# Patient Record
Sex: Male | Born: 1980 | Race: Black or African American | Hispanic: No | Marital: Single | Smoking: Current every day smoker
Health system: Southern US, Community
[De-identification: ages and names within clinical notes are randomized; demographics above are authoritative.]

## PROBLEM LIST (undated history)

## (undated) DIAGNOSIS — Z21 Asymptomatic human immunodeficiency virus [HIV] infection status: Secondary | ICD-10-CM

## (undated) DIAGNOSIS — F419 Anxiety disorder, unspecified: Secondary | ICD-10-CM

## (undated) DIAGNOSIS — F32A Depression, unspecified: Secondary | ICD-10-CM

## (undated) DIAGNOSIS — B20 Human immunodeficiency virus [HIV] disease: Secondary | ICD-10-CM

---

## 1998-07-15 ENCOUNTER — Emergency Department (HOSPITAL_COMMUNITY): Admission: EM | Admit: 1998-07-15 | Discharge: 1998-07-15 | Payer: Self-pay | Admitting: Emergency Medicine

## 1998-07-15 ENCOUNTER — Encounter: Payer: Self-pay | Admitting: Emergency Medicine

## 2004-01-06 ENCOUNTER — Emergency Department (HOSPITAL_COMMUNITY): Admission: EM | Admit: 2004-01-06 | Discharge: 2004-01-06 | Payer: Self-pay | Admitting: Emergency Medicine

## 2004-04-21 ENCOUNTER — Emergency Department (HOSPITAL_COMMUNITY): Admission: EM | Admit: 2004-04-21 | Discharge: 2004-04-21 | Payer: Self-pay | Admitting: Emergency Medicine

## 2005-07-06 ENCOUNTER — Encounter: Payer: Self-pay | Admitting: Cardiology

## 2005-07-06 ENCOUNTER — Observation Stay (HOSPITAL_COMMUNITY): Admission: EM | Admit: 2005-07-06 | Discharge: 2005-07-06 | Payer: Self-pay | Admitting: Emergency Medicine

## 2005-07-06 ENCOUNTER — Ambulatory Visit: Payer: Self-pay | Admitting: Cardiology

## 2005-11-09 ENCOUNTER — Emergency Department (HOSPITAL_COMMUNITY): Admission: EM | Admit: 2005-11-09 | Discharge: 2005-11-10 | Payer: Self-pay | Admitting: Emergency Medicine

## 2005-12-05 ENCOUNTER — Emergency Department (HOSPITAL_COMMUNITY): Admission: EM | Admit: 2005-12-05 | Discharge: 2005-12-05 | Payer: Self-pay | Admitting: Emergency Medicine

## 2005-12-30 ENCOUNTER — Emergency Department (HOSPITAL_COMMUNITY): Admission: EM | Admit: 2005-12-30 | Discharge: 2005-12-30 | Payer: Self-pay | Admitting: Emergency Medicine

## 2005-12-31 ENCOUNTER — Emergency Department (HOSPITAL_COMMUNITY): Admission: EM | Admit: 2005-12-31 | Discharge: 2005-12-31 | Payer: Self-pay | Admitting: Emergency Medicine

## 2006-01-31 ENCOUNTER — Emergency Department (HOSPITAL_COMMUNITY): Admission: EM | Admit: 2006-01-31 | Discharge: 2006-01-31 | Payer: Self-pay | Admitting: Emergency Medicine

## 2006-04-15 ENCOUNTER — Emergency Department (HOSPITAL_COMMUNITY): Admission: EM | Admit: 2006-04-15 | Discharge: 2006-04-15 | Payer: Self-pay | Admitting: Emergency Medicine

## 2006-04-19 ENCOUNTER — Emergency Department (HOSPITAL_COMMUNITY): Admission: EM | Admit: 2006-04-19 | Discharge: 2006-04-19 | Payer: Self-pay | Admitting: Emergency Medicine

## 2006-06-06 ENCOUNTER — Emergency Department (HOSPITAL_COMMUNITY): Admission: EM | Admit: 2006-06-06 | Discharge: 2006-06-06 | Payer: Self-pay | Admitting: Emergency Medicine

## 2006-08-19 ENCOUNTER — Emergency Department (HOSPITAL_COMMUNITY): Admission: EM | Admit: 2006-08-19 | Discharge: 2006-08-19 | Payer: Self-pay | Admitting: Emergency Medicine

## 2006-09-02 ENCOUNTER — Emergency Department (HOSPITAL_COMMUNITY): Admission: EM | Admit: 2006-09-02 | Discharge: 2006-09-02 | Payer: Self-pay | Admitting: *Deleted

## 2006-10-30 ENCOUNTER — Emergency Department (HOSPITAL_COMMUNITY): Admission: EM | Admit: 2006-10-30 | Discharge: 2006-10-31 | Payer: Self-pay | Admitting: Emergency Medicine

## 2006-11-05 ENCOUNTER — Emergency Department (HOSPITAL_COMMUNITY): Admission: EM | Admit: 2006-11-05 | Discharge: 2006-11-05 | Payer: Self-pay | Admitting: Emergency Medicine

## 2006-11-15 ENCOUNTER — Emergency Department (HOSPITAL_COMMUNITY): Admission: EM | Admit: 2006-11-15 | Discharge: 2006-11-15 | Payer: Self-pay | Admitting: Emergency Medicine

## 2006-11-19 ENCOUNTER — Emergency Department (HOSPITAL_COMMUNITY): Admission: EM | Admit: 2006-11-19 | Discharge: 2006-11-19 | Payer: Self-pay | Admitting: Emergency Medicine

## 2007-02-04 ENCOUNTER — Emergency Department (HOSPITAL_COMMUNITY): Admission: EM | Admit: 2007-02-04 | Discharge: 2007-02-04 | Payer: Self-pay | Admitting: Emergency Medicine

## 2007-02-12 ENCOUNTER — Emergency Department (HOSPITAL_COMMUNITY): Admission: EM | Admit: 2007-02-12 | Discharge: 2007-02-12 | Payer: Self-pay | Admitting: Emergency Medicine

## 2007-03-28 ENCOUNTER — Emergency Department (HOSPITAL_COMMUNITY): Admission: EM | Admit: 2007-03-28 | Discharge: 2007-03-28 | Payer: Self-pay | Admitting: Emergency Medicine

## 2007-07-07 ENCOUNTER — Emergency Department (HOSPITAL_COMMUNITY): Admission: EM | Admit: 2007-07-07 | Discharge: 2007-07-07 | Payer: Self-pay | Admitting: Emergency Medicine

## 2007-07-20 ENCOUNTER — Emergency Department (HOSPITAL_COMMUNITY): Admission: EM | Admit: 2007-07-20 | Discharge: 2007-07-21 | Payer: Self-pay | Admitting: Emergency Medicine

## 2008-01-12 ENCOUNTER — Emergency Department (HOSPITAL_COMMUNITY): Admission: EM | Admit: 2008-01-12 | Discharge: 2008-01-12 | Payer: Self-pay | Admitting: Emergency Medicine

## 2008-02-17 ENCOUNTER — Emergency Department (HOSPITAL_COMMUNITY): Admission: EM | Admit: 2008-02-17 | Discharge: 2008-02-17 | Payer: Self-pay | Admitting: Emergency Medicine

## 2008-02-28 ENCOUNTER — Emergency Department (HOSPITAL_COMMUNITY): Admission: EM | Admit: 2008-02-28 | Discharge: 2008-02-28 | Payer: Self-pay | Admitting: Emergency Medicine

## 2008-03-15 ENCOUNTER — Emergency Department (HOSPITAL_COMMUNITY): Admission: EM | Admit: 2008-03-15 | Discharge: 2008-03-16 | Payer: Self-pay | Admitting: Emergency Medicine

## 2008-06-03 ENCOUNTER — Emergency Department (HOSPITAL_COMMUNITY): Admission: EM | Admit: 2008-06-03 | Discharge: 2008-06-03 | Payer: Self-pay | Admitting: Emergency Medicine

## 2008-07-14 ENCOUNTER — Emergency Department (HOSPITAL_COMMUNITY): Admission: EM | Admit: 2008-07-14 | Discharge: 2008-07-14 | Payer: Self-pay | Admitting: Emergency Medicine

## 2008-11-10 ENCOUNTER — Emergency Department (HOSPITAL_COMMUNITY): Admission: EM | Admit: 2008-11-10 | Discharge: 2008-11-10 | Payer: Self-pay | Admitting: Emergency Medicine

## 2009-05-15 ENCOUNTER — Emergency Department (HOSPITAL_COMMUNITY): Admission: EM | Admit: 2009-05-15 | Discharge: 2009-05-15 | Payer: Self-pay | Admitting: Emergency Medicine

## 2009-05-16 ENCOUNTER — Emergency Department (HOSPITAL_COMMUNITY): Admission: EM | Admit: 2009-05-16 | Discharge: 2009-05-16 | Payer: Self-pay | Admitting: Emergency Medicine

## 2009-10-20 ENCOUNTER — Emergency Department (HOSPITAL_COMMUNITY): Admission: EM | Admit: 2009-10-20 | Discharge: 2009-10-20 | Payer: Self-pay | Admitting: Emergency Medicine

## 2009-12-11 ENCOUNTER — Emergency Department (HOSPITAL_COMMUNITY): Admission: EM | Admit: 2009-12-11 | Discharge: 2009-12-11 | Payer: Self-pay | Admitting: Emergency Medicine

## 2010-01-24 ENCOUNTER — Ambulatory Visit: Payer: Self-pay | Admitting: Adult Health

## 2010-02-23 ENCOUNTER — Emergency Department (HOSPITAL_COMMUNITY)
Admission: EM | Admit: 2010-02-23 | Discharge: 2010-02-23 | Payer: Self-pay | Source: Home / Self Care | Admitting: Emergency Medicine

## 2010-04-18 LAB — CBC
HCT: 34.2 % — ABNORMAL LOW (ref 39.0–52.0)
Hemoglobin: 11.7 g/dL — ABNORMAL LOW (ref 13.0–17.0)
MCH: 31.5 pg (ref 26.0–34.0)
MCHC: 34.2 g/dL (ref 30.0–36.0)
MCV: 92 fL (ref 78.0–100.0)
Platelets: 98 10*3/uL — ABNORMAL LOW (ref 150–400)
RBC: 3.71 MIL/uL — ABNORMAL LOW (ref 4.22–5.81)
RDW: 13.6 % (ref 11.5–15.5)
WBC: 3.5 10*3/uL — ABNORMAL LOW (ref 4.0–10.5)

## 2010-04-18 LAB — BASIC METABOLIC PANEL
BUN: 18 mg/dL (ref 6–23)
CO2: 27 mEq/L (ref 19–32)
Chloride: 100 mEq/L (ref 96–112)
Creatinine, Ser: 0.76 mg/dL (ref 0.4–1.5)
Glucose, Bld: 85 mg/dL (ref 70–99)
Sodium: 136 mEq/L (ref 135–145)

## 2010-04-18 LAB — DIFFERENTIAL
Eosinophils Absolute: 0 10*3/uL (ref 0.0–0.7)
Eosinophils Relative: 1 % (ref 0–5)
Monocytes Absolute: 0.4 10*3/uL (ref 0.1–1.0)
Neutro Abs: 1.8 10*3/uL (ref 1.7–7.7)
Neutrophils Relative %: 51 % (ref 43–77)

## 2010-04-18 LAB — HIV ANTIBODY (ROUTINE TESTING W REFLEX)

## 2010-04-20 LAB — CBC
HCT: 39.7 % (ref 39.0–52.0)
Hemoglobin: 13.8 g/dL (ref 13.0–17.0)
MCH: 32.8 pg (ref 26.0–34.0)
RBC: 4.21 MIL/uL — ABNORMAL LOW (ref 4.22–5.81)

## 2010-04-20 LAB — DIFFERENTIAL
Lymphocytes Relative: 33 % (ref 12–46)
Monocytes Relative: 9 % (ref 3–12)

## 2010-04-26 LAB — HEPATIC FUNCTION PANEL
Albumin: 4.1 g/dL (ref 3.5–5.2)
Total Bilirubin: 0.6 mg/dL (ref 0.3–1.2)
Total Protein: 8.9 g/dL — ABNORMAL HIGH (ref 6.0–8.3)

## 2010-04-26 LAB — URINALYSIS, ROUTINE W REFLEX MICROSCOPIC
Glucose, UA: NEGATIVE mg/dL
Hgb urine dipstick: NEGATIVE
Specific Gravity, Urine: 1.039 — ABNORMAL HIGH (ref 1.005–1.030)

## 2010-04-26 LAB — CK TOTAL AND CKMB (NOT AT ARMC)
CK, MB: 0.5 ng/mL (ref 0.3–4.0)
Total CK: 114 U/L (ref 7–232)

## 2010-04-26 LAB — URINE MICROSCOPIC-ADD ON

## 2010-04-26 LAB — POCT I-STAT, CHEM 8
BUN: 15 mg/dL (ref 6–23)
Creatinine, Ser: 1 mg/dL (ref 0.4–1.5)
Hemoglobin: 16 g/dL (ref 13.0–17.0)
Potassium: 4.8 mEq/L (ref 3.5–5.1)
Sodium: 139 mEq/L (ref 135–145)

## 2010-04-29 ENCOUNTER — Emergency Department (HOSPITAL_COMMUNITY)
Admission: EM | Admit: 2010-04-29 | Discharge: 2010-04-30 | Disposition: A | Discharge status: Home / Self Care | Payer: No Typology Code available for payment source | Attending: Emergency Medicine | Admitting: Emergency Medicine

## 2010-04-29 ENCOUNTER — Emergency Department (HOSPITAL_COMMUNITY): Payer: Self-pay

## 2010-04-29 DIAGNOSIS — M545 Low back pain, unspecified: Secondary | ICD-10-CM | POA: Insufficient documentation

## 2010-04-29 DIAGNOSIS — S025XXA Fracture of tooth (traumatic), initial encounter for closed fracture: Secondary | ICD-10-CM | POA: Insufficient documentation

## 2010-04-29 DIAGNOSIS — F1010 Alcohol abuse, uncomplicated: Secondary | ICD-10-CM | POA: Insufficient documentation

## 2010-04-29 DIAGNOSIS — R51 Headache: Secondary | ICD-10-CM | POA: Insufficient documentation

## 2010-04-29 DIAGNOSIS — S0003XA Contusion of scalp, initial encounter: Secondary | ICD-10-CM | POA: Insufficient documentation

## 2010-05-15 LAB — RAPID STREP SCREEN (MED CTR MEBANE ONLY): Streptococcus, Group A Screen (Direct): NEGATIVE

## 2010-05-23 LAB — D-DIMER, QUANTITATIVE: D-Dimer, Quant: <0.22 ug/mL-FEU (ref 0.00–0.48)

## 2010-05-23 LAB — CBC
MCV: 95.1 fL (ref 78.0–100.0)
RBC: 3.98 MIL/uL — ABNORMAL LOW (ref 4.22–5.81)
WBC: 8.6 10*3/uL (ref 4.0–10.5)

## 2010-05-23 LAB — POCT I-STAT, CHEM 8
BUN: 21 mg/dL (ref 6–23)
Calcium, Ion: 1.21 mmol/L (ref 1.12–1.32)
Chloride: 104 mEq/L (ref 96–112)
Potassium: 3.5 mEq/L (ref 3.5–5.1)

## 2010-05-23 LAB — POCT CARDIAC MARKERS
CKMB, poc: 1.4 ng/mL (ref 1.0–8.0)
Myoglobin, poc: 20.5 ng/mL (ref 12–200)
Troponin i, poc: <0.05 ng/mL (ref 0.00–0.09)

## 2010-06-23 NOTE — H&P (Signed)
NAME:  NOOR, VIDALES NO.:  [id number]   MEDICAL RECORD NO.:  [id number]          PATIENT TYPE:  EMS   LOCATION:  [facility]                         FACILITY:  [facility]   PHYSICIAN:  Mobolaji B. Bakare, M.D.DATE OF BIRTH:  [birth year]   DATE OF ADMISSION:  07/06/2005  DATE OF DISCHARGE:                                HISTORY & PHYSICAL   PRIMARY CARE PHYSICIAN:  Quita Skye. Kindl, M.D.   CHIEF COMPLAINT:  Abdominal pain, periumbilical, that started yesterday  afternoon at work.   HISTORY OF PRESENT ILLNESS:  Mr. Launer is a 30 year old, African-American  male who seems to have history of panic attacks.  He was at work yesterday  afternoon and suddenly developed periumbilical pain which rated at 7/10.  The pain is sharp and nonradiating.  It has remained constant.  There is no  associated vomiting or diarrhea.  He has not experienced any change in his  bowel habits, although his bowel habit is quite irregular.  He denies fever,  chills.  About the same time he developed this abdominal pain, he became  short of breath with associated perioral numbness and numbness of both arms.  He was brought to the emergency room for evaluation.   The patient had chest pain 2 weeks ago which lasted approximately 1 week.  It was retrosternal and he stated it was nonradiating.  It was associated  with feeling of chest tightness and difficulty with breathing.  There was no  palpitation.  This is similar to the chest pain experience he had in March  2006.   Blood work with cardiac enzymes and point of care markers done in the  emergency room revealed slightly elevated troponin of 0.17 to 0.18.  He has  normal CK-MB.  His EKG is normal sinus rhythm with early repolarization.   PAST MEDICAL HISTORY:  1.  Gout.  2.  Panic attacks.  3.  Back pain.   PAST SURGICAL HISTORY:  None.   MEDICATIONS:  Ibuprofen, Lorazepam, oxycodone, acetaminophen.   ALLERGIES:  No known drug allergies.   FAMILY HISTORY:  He is the last child of the family.  Both parents are alive  and they both have hypertension.  No family history of premature coronary  artery disease or cardiac problems that he is aware of.  No family history  of diabetes mellitus or cancer.   SOCIAL HISTORY:  The patient smokes cigarettes and finishes one pack in 3  days.  He occasionally drinks alcohol.  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  He is a Engineer, agricultural.   PHYSICAL EXAMINATION:  VITAL SIGNS:  Temperature 97.2, blood pressure  107/65, pulse 69, respirations 18, O2 saturations 100%.  GENERAL:  The patient is awake, oriented and alert to time, place and  person.  HEENT:  Normocephalic, atraumatic head.  Pupils equal round and reactive to  light, extraocular movements intact.  No elevated JVD or carotid bruits.  LUNGS:  Clear clinically to auscultation.  CARDIAC:  S1 and S2 regular.  No murmurs, rubs or gallops.  ABDOMEN:  Nondistended,  soft, mild periumbilical tenderness.  No palpable  organomegaly.  Bowel sounds present.  EXTREMITIES:  No pedal edema.  No calf tenderness.  NEUROLOGIC:  No focal neurological deficits.   LABORATORY DATA AND X-RAY FINDINGS:  White cell count 7.2, hemoglobin 13.7,  hematocrit 40.3, platelets 150, neutrophils 65, lymphs 27%.  ABG with pH  7.35, pCO2 51.5, bicarb 28.  Sodium 138, potassium 4.2, chloride 104,  glucose 118, BUN 19, creatinine 1.2.  Cardiac markers at 3:10 a.m. with CK-  MB 3.1, troponin 0.18, myoglobin 53.4.  D-dimer less than 0.22.  Urinalysis  showed moderate leukocytes.  Appearance is hazy.  Microscopy shows 11-20  wbc's.  Second set of cardiac markers with CK-MB normal, troponin 0.17.  Third set of cardiac markers with CK-MB normal, troponin 0.08.  Urine drug  screen with no abnormality detected.   Chest x-ray shows no active disease.   ASSESSMENT/PLAN:  1.  Mr. Ranganathan is a 30 year old, African-American male with history of  panic      attacks presenting with sudden-onset of abdominal pain associated with      symptoms suggestive of anxiety, i.e. numbness, tingling in lower limbs      and both hands.  I am not sure if the abdominal pain is related to the      numbness and tingling.  I would like to evaluate this further with a      computed tomography scan of the abdomen and pelvis to rule out      appendicitis, kidney and renal stones.  Will keep nothing by mouth until      computed tomography scan is reviewed.  Dilaudid 0.5 mg intravenous every      4 hours as needed for pain.  Check amylase and lipase.  2.  Numbness and tingling in lower limbs and hands, most likely anxiety.      The patient has a history of panic attack.  He would benefit from      psychiatric consult.  Will place on Xanax 0.5 mg every 4 hours p.r.n.      Check thyroid stimulating hormone.  3.  Chest pain.  He had chest pain 2 weeks ago and now has elevated troponin      without CK-MB elevation.  EKG is normal with early repolarization.      Probable pericarditis.  Will check 2-D echocardiogram, fasting lipid      profile, hemoglobin A1c and cycle cardiac enzymes.  Aspirin 81 mg daily.  4.  Asymptomatic bacteruria.  The patient has some white blood cells on      urine microscopy.  He is asymptomatic.  I will hold off antibiotic until      culture is available.  He does not have any fever or leukocytosis.      Mobolaji B. Corky Downs, M.D.  Electronically Signed     MBB/MEDQ  D:  07/06/2005  T:  07/06/2005  Job:  [id number]   cc:   Quita Skye. Artis Flock, M.D.  Fax: [phone]

## 2010-07-05 LAB — HEPATIC FUNCTION PANEL: Bilirubin, Total: 0.4 mg/dL

## 2010-07-05 LAB — CBC AND DIFFERENTIAL
HCT: 37 % — AB (ref 41–53)
Hemoglobin: 12.5 g/dL — AB (ref 13.5–17.5)
Platelets: 113 10*3/uL — AB (ref 150–399)

## 2010-07-05 LAB — BASIC METABOLIC PANEL: Sodium: 138 mmol/L (ref 137–147)

## 2010-10-26 LAB — GC/CHLAMYDIA PROBE AMP, GENITAL: GC Probe Amp, Genital: NEGATIVE

## 2010-11-02 LAB — URINALYSIS, ROUTINE W REFLEX MICROSCOPIC
Glucose, UA: NEGATIVE
Hgb urine dipstick: NEGATIVE
Ketones, ur: 15 — AB
Protein, ur: NEGATIVE
pH: 6

## 2010-11-02 LAB — CBC
HCT: 40.1
Hemoglobin: 14.1
MCHC: 35
MCV: 95.3
Platelets: 221
RDW: 13.5

## 2010-11-02 LAB — POCT I-STAT, CHEM 8
BUN: 19
Calcium, Ion: 1.18
Chloride: 103
Creatinine, Ser: 1.1
TCO2: 28

## 2010-11-02 LAB — DIFFERENTIAL
Basophils Absolute: 0
Basophils Relative: 0
Eosinophils Absolute: 0.1
Eosinophils Relative: 1
Monocytes Absolute: 0.5

## 2010-11-02 LAB — POCT CARDIAC MARKERS: Operator id: 277751

## 2010-11-02 LAB — URINE MICROSCOPIC-ADD ON

## 2010-11-02 LAB — RAPID STREP SCREEN (MED CTR MEBANE ONLY): Streptococcus, Group A Screen (Direct): POSITIVE — AB

## 2010-11-06 ENCOUNTER — Emergency Department (HOSPITAL_COMMUNITY)
Admission: EM | Admit: 2010-11-06 | Discharge: 2010-11-06 | Disposition: A | Discharge status: Home / Self Care | Payer: Self-pay | Attending: Emergency Medicine | Admitting: Emergency Medicine

## 2010-11-06 DIAGNOSIS — B9789 Other viral agents as the cause of diseases classified elsewhere: Secondary | ICD-10-CM | POA: Insufficient documentation

## 2010-11-06 DIAGNOSIS — Z21 Asymptomatic human immunodeficiency virus [HIV] infection status: Secondary | ICD-10-CM | POA: Insufficient documentation

## 2010-11-17 ENCOUNTER — Emergency Department (HOSPITAL_COMMUNITY)
Admission: EM | Admit: 2010-11-17 | Discharge: 2010-11-17 | Disposition: A | Discharge status: Home / Self Care | Payer: No Typology Code available for payment source | Attending: Emergency Medicine | Admitting: Emergency Medicine

## 2010-11-17 DIAGNOSIS — R1013 Epigastric pain: Secondary | ICD-10-CM | POA: Insufficient documentation

## 2010-11-17 DIAGNOSIS — K3189 Other diseases of stomach and duodenum: Secondary | ICD-10-CM | POA: Insufficient documentation

## 2010-11-17 DIAGNOSIS — Z21 Asymptomatic human immunodeficiency virus [HIV] infection status: Secondary | ICD-10-CM | POA: Insufficient documentation

## 2010-11-17 LAB — COMPREHENSIVE METABOLIC PANEL
ALT: 8 U/L (ref 0–53)
AST: 17 U/L (ref 0–37)
Albumin: 3.7 g/dL (ref 3.5–5.2)
Calcium: 9.9 mg/dL (ref 8.4–10.5)
Chloride: 100 mEq/L (ref 96–112)
Creatinine, Ser: 0.81 mg/dL (ref 0.50–1.35)
Glucose, Bld: 88 mg/dL (ref 70–99)
Potassium: 4.1 mEq/L (ref 3.5–5.1)
Total Protein: 9.2 g/dL — ABNORMAL HIGH (ref 6.0–8.3)

## 2010-11-17 LAB — CBC
HCT: 39 % (ref 39.0–52.0)
Hemoglobin: 13.6 g/dL (ref 13.0–17.0)
WBC: 3.4 10*3/uL — ABNORMAL LOW (ref 4.0–10.5)

## 2010-11-17 LAB — DIFFERENTIAL
Basophils Absolute: 0 10*3/uL (ref 0.0–0.1)
Lymphocytes Relative: 48 % — ABNORMAL HIGH (ref 12–46)
Lymphs Abs: 1.7 10*3/uL (ref 0.7–4.0)
Monocytes Absolute: 0.4 10*3/uL (ref 0.1–1.0)
Neutro Abs: 1.3 10*3/uL — ABNORMAL LOW (ref 1.7–7.7)

## 2011-03-09 ENCOUNTER — Encounter (HOSPITAL_COMMUNITY): Payer: Self-pay | Admitting: Emergency Medicine

## 2011-03-09 ENCOUNTER — Emergency Department (HOSPITAL_COMMUNITY)
Admission: EM | Admit: 2011-03-09 | Discharge: 2011-03-09 | Disposition: A | Discharge status: Home / Self Care | Payer: Self-pay | Attending: Emergency Medicine | Admitting: Emergency Medicine

## 2011-03-09 ENCOUNTER — Emergency Department (HOSPITAL_COMMUNITY): Payer: Self-pay

## 2011-03-09 ENCOUNTER — Other Ambulatory Visit: Payer: Self-pay

## 2011-03-09 DIAGNOSIS — F17200 Nicotine dependence, unspecified, uncomplicated: Secondary | ICD-10-CM | POA: Insufficient documentation

## 2011-03-09 DIAGNOSIS — R0789 Other chest pain: Secondary | ICD-10-CM | POA: Insufficient documentation

## 2011-03-09 DIAGNOSIS — K4090 Unilateral inguinal hernia, without obstruction or gangrene, not specified as recurrent: Secondary | ICD-10-CM | POA: Insufficient documentation

## 2011-03-09 LAB — POCT I-STAT TROPONIN I: Troponin i, poc: 0 ng/mL (ref 0.00–0.08)

## 2011-03-09 LAB — BASIC METABOLIC PANEL
CO2: 26 mEq/L (ref 19–32)
Calcium: 9 mg/dL (ref 8.4–10.5)
Chloride: 103 mEq/L (ref 96–112)
Creatinine, Ser: 1.11 mg/dL (ref 0.50–1.35)
Glucose, Bld: 93 mg/dL (ref 70–99)
Sodium: 137 mEq/L (ref 135–145)

## 2011-03-09 LAB — CBC
Hemoglobin: 12.8 g/dL — ABNORMAL LOW (ref 13.0–17.0)
MCH: 32.9 pg (ref 26.0–34.0)
MCV: 96.1 fL (ref 78.0–100.0)
Platelets: 102 10*3/uL — ABNORMAL LOW (ref 150–400)
RBC: 3.89 MIL/uL — ABNORMAL LOW (ref 4.22–5.81)
WBC: 3 10*3/uL — ABNORMAL LOW (ref 4.0–10.5)

## 2011-03-09 LAB — DIFFERENTIAL
Basophils Relative: 1 % (ref 0–1)
Eosinophils Relative: 3 % (ref 0–5)
Lymphs Abs: 1.6 10*3/uL (ref 0.7–4.0)
Monocytes Absolute: 0.4 10*3/uL (ref 0.1–1.0)
Monocytes Relative: 12 % (ref 3–12)

## 2011-03-09 MED ORDER — KETOROLAC TROMETHAMINE 60 MG/2ML IM SOLN
60.0000 mg | Freq: Once | INTRAMUSCULAR | Status: AC
  Administered 2011-03-09: 60 mg via INTRAMUSCULAR
  Filled 2011-03-09: qty 2

## 2011-03-09 MED ORDER — MELOXICAM 7.5 MG PO TABS: 7.5000 mg | ORAL_TABLET | Freq: Every day | ORAL | Status: DC

## 2011-03-09 MED ORDER — ASPIRIN 81 MG PO CHEW
324.0000 mg | CHEWABLE_TABLET | Freq: Once | ORAL | Status: AC
  Administered 2011-03-09: 324 mg via ORAL
  Filled 2011-03-09: qty 4

## 2011-03-09 NOTE — ED Notes (Signed)
Pt c/o left sided CP since this morning.  States pain is intermittent, 8/10 at this time.  Pain does not worsen with inspiration or palpation.  Denies n/v/d.  Also c/o 10/10 pain in hernia near the left groin.  States he has had hernia since he was a baby and they have been unable to reduce it.

## 2011-03-09 NOTE — ED Provider Notes (Signed)
Medical screening examination/treatment/procedure(s) were performed by non-physician practitioner and as supervising physician I was immediately available for consultation/collaboration.   Hanley Seamen, MD 03/09/11 [phone]

## 2011-03-09 NOTE — ED Notes (Signed)
Pt. Reports intermittent mid sternal chest pain onset yesterday morning , denies sob , no nausea or diaphoresis. Pt. also reports left inguinal pain for several weeks.

## 2011-03-09 NOTE — ED Provider Notes (Signed)
History     CSN: [id number]  Arrival date & time 03/09/11  0159   First MD Initiated Contact with Patient 03/09/11 [phone]      Chief Complaint  Patient presents with  . Chest Pain    (Consider location/radiation/quality/duration/timing/severity/associated sxs/prior treatment) HPI Comments: Patient here with intermittent left anterior chest pain - states that the pain woke him up yesterday morning at about 1am - states that he stayed up for a while, then went back to sleep - states that the pain is intermittent and nothing he has done has made the pain either better or worse, though sitting forward does make it slightly worse, he has a history of pericarditis in the past but states that this does not feel like the past one.  Denies cough, congestion, fever, chills, shortness of breath, nausea or vomiting.  Patient is a 30 y.o. male presenting with chest pain. The history is provided by the patient. No language interpreter was used.  Chest Pain The chest pain began 2 days ago. Duration of episode(s) is 4 hours. Chest pain occurs intermittently. The chest pain is worsening. At its most intense, the pain is at 9/10. The pain is currently at 5/10. The severity of the pain is severe. The quality of the pain is described as sharp and squeezing. The pain does not radiate. Chest pain is worsened by certain positions. Pertinent negatives for primary symptoms include no fever, no syncope, no shortness of breath, no cough, no palpitations, no abdominal pain, no nausea, no vomiting, no dizziness and no altered mental status.  Pertinent negatives for associated symptoms include no claudication, no lower extremity edema, no near-syncope, no numbness, no orthopnea, no paroxysmal nocturnal dyspnea and no weakness. He tried nothing for the symptoms. Risk factors include smoking/tobacco exposure.  Pertinent negatives for past medical history include no CAD, no diabetes, no hyperlipidemia, no hypertension, no PE and  no stimulant use.  His family medical history is significant for diabetes in family, heart disease in family and hypertension in family.  Pertinent negatives for family medical history include: no early MI in family.     History reviewed. No pertinent past medical history.  History reviewed. No pertinent past surgical history.  No family history on file.  History  Substance Use Topics  . Smoking status: Current Everyday Smoker  . Smokeless tobacco: Not on file  . Alcohol Use: Yes      Review of Systems  Constitutional: Negative for fever.  Respiratory: Negative for cough and shortness of breath.   Cardiovascular: Positive for chest pain. Negative for palpitations, orthopnea, claudication, syncope and near-syncope.  Gastrointestinal: Negative for nausea, vomiting and abdominal pain.  Neurological: Negative for dizziness, weakness and numbness.  Psychiatric/Behavioral: Negative for altered mental status.  All other systems reviewed and are negative.    Allergies  Review of patient's allergies indicates not on file.  Home Medications  No current outpatient prescriptions on file.  BP 99/57  Pulse 65  Temp(Src) 97.9 F (36.6 C) (Oral)  Resp 18  SpO2 99%  Physical Exam  Nursing note and vitals reviewed. Constitutional: He is oriented to person, place, and time. He appears well-developed and well-nourished. No distress.  HENT:  Head: Normocephalic and atraumatic.  Right Ear: External ear normal.  Left Ear: External ear normal.  Nose: Nose normal.  Mouth/Throat: Oropharynx is clear and moist. No oropharyngeal exudate.  Eyes: Conjunctivae are normal. Pupils are equal, round, and reactive to light. No scleral icterus.  Neck: Normal range  of motion. Neck supple.  Cardiovascular: Normal rate, regular rhythm and normal heart sounds.  Exam reveals no gallop and no friction rub.   No murmur heard. Pulmonary/Chest: Effort normal and breath sounds normal. No respiratory  distress. He has no wheezes. He has no rales.    Abdominal: Soft. Bowel sounds are normal. He exhibits no distension. A hernia is present. Hernia confirmed positive in the left inguinal area.  Genitourinary:       Left inguinal hernia that was easily reduced  Musculoskeletal: Normal range of motion.  Lymphadenopathy:    He has no cervical adenopathy.       Left: No inguinal adenopathy present.  Neurological: He is alert and oriented to person, place, and time. No cranial nerve deficit.  Skin: Skin is warm and dry. No rash noted. No erythema. No pallor.  Psychiatric: He has a normal mood and affect. His behavior is normal. Judgment and thought content normal.    ED Course  Hernia reduction Date/Time: 03/09/2011 4:51 AM Performed by: Marisue Humble, Danel Requena C. Authorized by: Patrecia Pour Consent: Verbal consent obtained. Written consent not obtained. Risks and benefits: risks, benefits and alternatives were discussed Consent given by: patient Patient understanding: patient states understanding of the procedure being performed Patient consent: the patient's understanding of the procedure does not match consent given Procedure consent: procedure consent does not match procedure scheduled Relevant documents: relevant documents not present or verified Test results: test results not available Site marked: the operative site was not marked Imaging studies: imaging studies not available Patient identity confirmed: verbally with patient and arm band Time out: Immediately prior to procedure a "time out" was called to verify the correct patient, procedure, equipment, support staff and site/side marked as required. Local anesthesia used: no Patient sedated: no Patient tolerance: Patient tolerated the procedure well with no immediate complications. Comments: Using gentle pressure was able to reduce the left inguinal hernia    (including critical care time)  Labs Reviewed  CBC - Abnormal;  Notable for the following:    WBC 3.0 (*)    RBC 3.89 (*)    Hemoglobin 12.8 (*)    HCT 37.4 (*)    All other components within normal limits  DIFFERENTIAL  POCT I-STAT TROPONIN I  BASIC METABOLIC PANEL   Dg Chest 2 View  03/09/2011  *RADIOLOGY REPORT*  Clinical Data: Left-sided chest pain and shortness of breath  CHEST - 2 VIEW  Comparison: 10/20/2009  Findings: Normal heart size and pulmonary vascularity.  No focal airspace consolidation in the lungs.  No blunting of costophrenic angles.  No pneumothorax.  No significant change since previous study.  IMPRESSION: No evidence of active pulmonary disease.  Original Report Authenticated By: Marlon Pel, M.D.    Date: 03/09/2011  Rate: 66  Rhythm: normal sinus rhythm  QRS Axis: normal  Intervals: normal  ST/T Wave abnormalities: normal  Conduction Disutrbances:none  Narrative Interpretation: possible left atrial enlargement - reviewed by Dr. Read Drivers  Old EKG Reviewed: none available    Atypical Chest pain Left inguinal hernia    MDM  Patient with easily reducible left inguinal hernia.  Reports this has been there "for years" - would like referral to surgery for this - reports chest pain relieved with toradol, has history of pericarditis, but I do not suspect this or ACS, or CAD        Scarlette Calico C. [place], [place] 03/09/11 [phone]

## 2011-03-09 NOTE — ED Notes (Signed)
rx x 1, pt voiced understanding to f/u with surgeon.

## 2011-04-16 ENCOUNTER — Encounter: Payer: Self-pay | Admitting: *Deleted

## 2011-04-16 NOTE — Progress Notes (Signed)
PATIENT LOST TO CARE REFERRED TO [facility] FOR FOLLOW UP.

## 2011-04-23 ENCOUNTER — Encounter: Payer: Self-pay | Admitting: *Deleted

## 2011-04-23 NOTE — Progress Notes (Signed)
Patient ID: Frank Stark, male   DOB: Jun 18, 1980, 31 y.o.   MRN: [id number] Patient is receiving care at [facility].

## 2011-11-13 ENCOUNTER — Encounter (HOSPITAL_COMMUNITY): Payer: Self-pay | Admitting: *Deleted

## 2011-11-13 ENCOUNTER — Emergency Department (HOSPITAL_COMMUNITY)
Admission: EM | Admit: 2011-11-13 | Discharge: 2011-11-13 | Disposition: A | Discharge status: Home / Self Care | Payer: Self-pay | Attending: Emergency Medicine | Admitting: Emergency Medicine

## 2011-11-13 DIAGNOSIS — R229 Localized swelling, mass and lump, unspecified: Secondary | ICD-10-CM | POA: Insufficient documentation

## 2011-11-13 DIAGNOSIS — F17200 Nicotine dependence, unspecified, uncomplicated: Secondary | ICD-10-CM | POA: Insufficient documentation

## 2011-11-13 NOTE — ED Notes (Signed)
MD at bedside. 

## 2011-11-13 NOTE — ED Notes (Signed)
Pt reports right underarm pain started Sunday, 8/10 when moving arm. Full active ROM. No bump noted to axilla. Pt denies any trauma or fall. In no acute distress.

## 2011-11-13 NOTE — ED Provider Notes (Signed)
History     CSN: [id number]  Arrival date & time 11/13/11  1107   First MD Initiated Contact with Patient 11/13/11 1218      Chief Complaint  Patient presents with  . Shoulder Pain    (Consider location/radiation/quality/duration/timing/severity/associated sxs/prior treatment) HPI Comments: This is a 31 year old male who presents to the ED with a chief complaint of pain in the right axilla.  Patient states that he can feel a knot in the axilla and that it hurts if he lifts his arm over his head.  There is no swelling, erythema. The pain is worsened with movement. He has not tried taking any medications to relieve his symptoms. The pain does not radiate. Onset was 2 days ago.  The history is provided by the patient. No language interpreter was used.    History reviewed. No pertinent past medical history.  History reviewed. No pertinent past surgical history.  History reviewed. No pertinent family history.  History  Substance Use Topics  . Smoking status: Current Every Day Smoker -- 0.5 packs/day for 15 years    Types: Cigarettes  . Smokeless tobacco: Not on file  . Alcohol Use: Yes      Review of Systems  Constitutional: Negative for fever.  HENT: Negative for neck pain.   Eyes: Negative for visual disturbance.  Respiratory: Negative for chest tightness and shortness of breath.   Cardiovascular: Negative for chest pain.  Gastrointestinal: Negative for abdominal pain.  Genitourinary: Negative for dysuria.  Musculoskeletal: Negative for back pain.       Right arm pain  Skin:       Right arm pain  Neurological: Negative for weakness.  Hematological: Negative for adenopathy.  Psychiatric/Behavioral: Negative for agitation.  All other systems reviewed and are negative.    Allergies  Review of patient's allergies indicates no known allergies.  Home Medications  No current outpatient prescriptions on file.  BP 104/59  Pulse 93  Temp 98.5 F (36.9 C)  Resp 16   SpO2 99%  Physical Exam  Nursing note and vitals reviewed. Constitutional: He is oriented to person, place, and time. He appears well-developed and well-nourished.  HENT:  Head: Normocephalic and atraumatic.  Eyes: Conjunctivae normal and EOM are normal. Pupils are equal, round, and reactive to light.  Neck: Normal range of motion. Neck supple.  Cardiovascular: Normal rate, regular rhythm and normal heart sounds.   Pulmonary/Chest: Effort normal and breath sounds normal.  Abdominal: Soft. Bowel sounds are normal.  Musculoskeletal: Normal range of motion.  Neurological: He is alert and oriented to person, place, and time.  Skin: Skin is warm and dry.       Nonspecific skin nodule in her right axilla. Painful to palpation. Approximately 1 cm in length and width.  Psychiatric: He has a normal mood and affect. His behavior is normal. Judgment and thought content normal.    ED Course  Procedures (including critical care time)  Labs Reviewed - No data to display No results found.  Bedside ultrasound was performed by Dr. Rulon Abide. No evidence of cellulitis or fluid was seen on ultrasound.  1. Skin nodule       MDM  This 31 year old male, with nonspecific skin nodule under the right axilla. This patient was seen by and discussed with Dr. Rulon Abide. Bedside ultrasound was performed as documented above. Is there is no sign of swelling, erythema, or cellulitis, will discharge the patient to home with instructions to use warm compresses, ibuprofen, Tylenol for symptomatic  relief. Return precautions have been given. The patient is also instructed to followup with his primary care provider. The patient is stable and ready for discharge.        Roxy Horseman, PA-C 11/13/11 1402

## 2011-11-14 NOTE — ED Provider Notes (Signed)
Medical screening examination/treatment/procedure(s) were conducted as a shared visit with non-physician practitioner(s) and myself.  I personally evaluated the patient during the encounter Jones Skene, M.D.  AMAURIE WANDEL is a 31 y.o. male presenting with a small nodular area in the right axilla. This is very tender to palpation, this is just slightly subcutaneous, do not think this is in the deep space and I do not think this is a lymph node. Performed a bedside ultrasound: See below  [place] bedside Performed by: Jones Skene Authorized by: Jones Skene Consent: Verbal consent obtained. Local anesthesia used: no Patient sedated: no Patient tolerance: Patient tolerated the procedure well with no immediate complications. Comments: Bedside ultrasound showed a small half centimeter by half centimeter flattened area of tissue, over the area of tenderness. There is no surrounding hypoechoic cellulitic region, there is no fluid collection suggestive of abscess.   Warm compresses and Tylenol as needed for pain. Return to the ER for worsening condition.  Jones Skene, MD 11/14/11 1338

## 2011-11-15 ENCOUNTER — Emergency Department (HOSPITAL_COMMUNITY): Payer: Self-pay

## 2011-11-15 ENCOUNTER — Emergency Department (HOSPITAL_COMMUNITY)
Admission: EM | Admit: 2011-11-15 | Discharge: 2011-11-15 | Disposition: A | Discharge status: Home / Self Care | Payer: Self-pay | Attending: Emergency Medicine | Admitting: Emergency Medicine

## 2011-11-15 ENCOUNTER — Encounter (HOSPITAL_COMMUNITY): Payer: Self-pay | Admitting: Emergency Medicine

## 2011-11-15 DIAGNOSIS — R091 Pleurisy: Secondary | ICD-10-CM | POA: Insufficient documentation

## 2011-11-15 DIAGNOSIS — F17200 Nicotine dependence, unspecified, uncomplicated: Secondary | ICD-10-CM | POA: Insufficient documentation

## 2011-11-15 MED ORDER — NAPROXEN 500 MG PO TABS: 500.0000 mg | ORAL_TABLET | Freq: Two times a day (BID) | ORAL | Status: DC

## 2011-11-15 NOTE — ED Provider Notes (Signed)
History     CSN: [id number]  Arrival date & time 11/15/11  1449   First MD Initiated Contact with Patient 11/15/11 1759      Chief Complaint  Patient presents with  . Chest Pain    (Consider location/radiation/quality/duration/timing/severity/associated sxs/prior treatment) Patient is a 31 y.o. male presenting with chest pain. The history is provided by the patient.  Chest Pain The chest pain began yesterday. Chest pain occurs constantly. The chest pain is unchanged. Associated with: moving, breathing. At its most intense, the pain is at 9/10. The pain is currently at 4/10. The severity of the pain is moderate. The quality of the pain is described as sharp. The pain does not radiate. Chest pain is worsened by certain positions and deep breathing. Primary symptoms include shortness of breath. Pertinent negatives for primary symptoms include no fever, no cough, no wheezing, no palpitations, no abdominal pain, no nausea, no vomiting and no dizziness.  Pertinent negatives for associated symptoms include no lower extremity edema, no near-syncope, no numbness, no orthopnea and no weakness. He tried nothing for the symptoms.   Pt states right sided chest pain for two days. Sates today worsened, pain with movement of right arm and taking deep breath. Denies injuries. Denies fever, chills, malaise. No other complaints  History reviewed. No pertinent past medical history.  History reviewed. No pertinent past surgical history.  No family history on file.  History  Substance Use Topics  . Smoking status: Current Every Day Smoker -- 0.5 packs/day for 15 years    Types: Cigarettes  . Smokeless tobacco: Not on file  . Alcohol Use: Yes      Review of Systems  Constitutional: Negative for fever and chills.  Respiratory: Positive for chest tightness and shortness of breath. Negative for cough and wheezing.   Cardiovascular: Positive for chest pain. Negative for palpitations, orthopnea, leg  swelling and near-syncope.  Gastrointestinal: Negative for nausea, vomiting and abdominal pain.  Genitourinary: Negative for dysuria.  Musculoskeletal: Negative for back pain.  Skin: Negative.   Neurological: Negative for dizziness, weakness and numbness.    Allergies  Review of patient's allergies indicates no known allergies.  Home Medications  No current outpatient prescriptions on file.  BP 98/52  Pulse 67  Temp 98.2 F (36.8 C) (Oral)  Resp 18  SpO2 100%  Physical Exam  Nursing note and vitals reviewed. Constitutional: He appears well-developed and well-nourished. No distress.  HENT:  Head: Normocephalic.  Neck: Neck supple.  Cardiovascular: Normal rate, regular rhythm and normal heart sounds.   Pulmonary/Chest: Effort normal and breath sounds normal. No respiratory distress. He has no wheezes. He has no rales. He exhibits no tenderness.  Abdominal: Soft. Bowel sounds are normal. He exhibits no distension. There is no tenderness. There is no rebound.  Musculoskeletal: He exhibits no edema.       Pain over right pectoralis with movement of right arm against resistance at shoulder joint  Skin: Skin is warm and dry.    ED Course  Procedures (including critical care time)  Pt with right sided chest pain, no injury, PERC negative, TIMI 0, cp atypical. VS normal. Will get cxr.    Date: 11/15/2011  Rate: 68  Rhythm: normal sinus rhythm  QRS Axis: normal  Intervals: normal  ST/T Wave abnormalities: early repolarization  Conduction Disutrbances:none  Narrative Interpretation:   Old EKG Reviewed: unchanged  Dg Chest 2 View  11/15/2011  *RADIOLOGY REPORT*  Clinical Data: Right-sided chest pain  CHEST - 2  VIEW  Comparison: 03/09/2011  Findings: The heart and pulmonary vascularity are within normal limits.  The lungs are clear bilaterally.  No acute bony abnormality is seen.  IMPRESSION: No acute abnormality noted.   Original Report Authenticated By: Phillips Odor, M.D.       Negative CXR and ECG. Pt is PERC negative, pain atypical, chest wall/pleuritic, no risk factors for ACS. Will treat with anti inflammatories, follow up as needed   Filed Vitals:   11/15/11 1621  BP: 98/52  Pulse: 67  Temp: 98.2 F (36.8 C)  Resp: 18     1. Pleurisy       MDM          Lottie Mussel, PA 11/16/11 [phone]

## 2011-11-15 NOTE — ED Notes (Signed)
Pt presenting to ed with c/o chest pain, and shortness of breath. Pt denies nausea and vomiting at this time.

## 2011-11-15 NOTE — ED Notes (Signed)
Pt verbalizes understanding 

## 2011-11-17 NOTE — ED Provider Notes (Signed)
Medical screening examination/treatment/procedure(s) were performed by non-physician practitioner and as supervising physician I was immediately available for consultation/collaboration.    Levia Waltermire R Sherel Fennell, MD 11/17/11 1652 

## 2011-12-10 ENCOUNTER — Emergency Department (HOSPITAL_COMMUNITY)
Admission: EM | Admit: 2011-12-10 | Discharge: 2011-12-10 | Disposition: A | Discharge status: Home / Self Care | Payer: Self-pay | Attending: Emergency Medicine | Admitting: Emergency Medicine

## 2011-12-10 ENCOUNTER — Encounter (HOSPITAL_COMMUNITY): Payer: Self-pay

## 2011-12-10 ENCOUNTER — Encounter (HOSPITAL_COMMUNITY): Payer: Self-pay | Admitting: *Deleted

## 2011-12-10 ENCOUNTER — Emergency Department (HOSPITAL_COMMUNITY)
Admission: EM | Admit: 2011-12-10 | Discharge: 2011-12-11 | Disposition: A | Discharge status: Home / Self Care | Payer: Self-pay | Attending: Emergency Medicine | Admitting: Emergency Medicine

## 2011-12-10 ENCOUNTER — Emergency Department (HOSPITAL_COMMUNITY): Payer: Self-pay

## 2011-12-10 DIAGNOSIS — J020 Streptococcal pharyngitis: Secondary | ICD-10-CM | POA: Insufficient documentation

## 2011-12-10 DIAGNOSIS — J029 Acute pharyngitis, unspecified: Secondary | ICD-10-CM | POA: Insufficient documentation

## 2011-12-10 DIAGNOSIS — Z21 Asymptomatic human immunodeficiency virus [HIV] infection status: Secondary | ICD-10-CM | POA: Insufficient documentation

## 2011-12-10 DIAGNOSIS — M79609 Pain in unspecified limb: Secondary | ICD-10-CM | POA: Insufficient documentation

## 2011-12-10 DIAGNOSIS — F17200 Nicotine dependence, unspecified, uncomplicated: Secondary | ICD-10-CM | POA: Insufficient documentation

## 2011-12-10 DIAGNOSIS — M79601 Pain in right arm: Secondary | ICD-10-CM

## 2011-12-10 DIAGNOSIS — B20 Human immunodeficiency virus [HIV] disease: Secondary | ICD-10-CM | POA: Insufficient documentation

## 2011-12-10 DIAGNOSIS — Y839 Surgical procedure, unspecified as the cause of abnormal reaction of the patient, or of later complication, without mention of misadventure at the time of the procedure: Secondary | ICD-10-CM | POA: Insufficient documentation

## 2011-12-10 HISTORY — DX: Human immunodeficiency virus (HIV) disease: B20

## 2011-12-10 HISTORY — DX: Asymptomatic human immunodeficiency virus (hiv) infection status: Z21

## 2011-12-10 LAB — RAPID STREP SCREEN (MED CTR MEBANE ONLY): Streptococcus, Group A Screen (Direct): POSITIVE — AB

## 2011-12-10 MED ORDER — IBUPROFEN 800 MG PO TABS
800.0000 mg | ORAL_TABLET | Freq: Once | ORAL | Status: AC
  Administered 2011-12-10: 800 mg via ORAL
  Filled 2011-12-10: qty 1

## 2011-12-10 MED ORDER — PENICILLIN G BENZATHINE 1200000 UNIT/2ML IM SUSP
1.2000 10*6.[IU] | Freq: Once | INTRAMUSCULAR | Status: AC
  Administered 2011-12-10: 1.2 10*6.[IU] via INTRAMUSCULAR
  Filled 2011-12-10: qty 2

## 2011-12-10 NOTE — ED Provider Notes (Signed)
History     CSN: [id number]  Arrival date & time 12/10/11  1130   First MD Initiated Contact with Patient 12/10/11 1232      Chief Complaint  Patient presents with  . Sore Throat    (Consider location/radiation/quality/duration/timing/severity/associated sxs/prior treatment) HPI Comments: Patient is a 31 year old HIV-positive male who presents with a sore throat for the past 3 days. The throat pain started gradually and progressively worsened since the onset. The pain is sharp, severe, and constant. The pain does not radiate and is made worse by swallowing. No alleviating factors. Patient has tried cough drops and chloraseptic for symptoms without relief. Patient reports associated cough, cervical adenopathy and subjective fever. Patient denies headache, visual changes, NVD, sinus congestion, chest pain, SOB, abdominal pain.   Patient is a 31 y.o. male presenting with pharyngitis.  Sore Throat Associated symptoms include chills, coughing, a fever and a sore throat.    Past Medical History  Diagnosis Date  . HIV (human immunodeficiency virus infection)     History reviewed. No pertinent past surgical history.  Family History  Problem Relation Age of Onset  . Hypertension Mother   . Hypertension Father   . Asthma Sister     History  Substance Use Topics  . Smoking status: Current Every Day Smoker -- 1.0 packs/day for 15 years    Types: Cigarettes  . Smokeless tobacco: Never Used  . Alcohol Use: Yes     Comment: daily /3-4 beers daily      Review of Systems  Constitutional: Positive for fever and chills.  HENT: Positive for sore throat.   Respiratory: Positive for cough.   All other systems reviewed and are negative.    Allergies  Review of patient's allergies indicates no known allergies.  Home Medications  No current outpatient prescriptions on file.  BP 109/60  Pulse 77  Temp 98.8 F (37.1 C) (Oral)  Resp 18  SpO2 100%  Physical Exam  Nursing note  and vitals reviewed. Constitutional: He is oriented to person, place, and time. He appears well-developed and well-nourished. No distress.  HENT:  Head: Normocephalic and atraumatic.  Mouth/Throat: No oropharyngeal exudate.       Posterior pharynx erythematous and edematous without exudate.   Eyes: Conjunctivae normal and EOM are normal. Pupils are equal, round, and reactive to light. No scleral icterus.  Neck: Normal range of motion. Neck supple.       Cervical adenopathy that is tender to palpation.   Cardiovascular: Normal rate and regular rhythm.  Exam reveals no gallop and no friction rub.   No murmur heard. Pulmonary/Chest: Effort normal and breath sounds normal. He has no wheezes. He has no rales. He exhibits no tenderness.  Abdominal: Soft. He exhibits no distension. There is no tenderness. There is no rebound.  Musculoskeletal: Normal range of motion.  Lymphadenopathy:    He has cervical adenopathy.  Neurological: He is alert and oriented to person, place, and time. Coordination normal.       Speech is goal-oriented. Moves limbs without ataxia.   Skin: Skin is warm and dry. He is not diaphoretic.  Psychiatric: He has a normal mood and affect. His behavior is normal.    ED Course  Procedures (including critical care time)  Labs Reviewed  RAPID STREP SCREEN - Abnormal; Notable for the following:    Streptococcus, Group A Screen (Direct) POSITIVE (*)     All other components within normal limits   No results found.  1. Strep throat       MDM  1:04 PM Rapid strep pending. Patient will have a chest xray as well.   1:22 PM Patient strep test positive. Will treat with 1.2 million units Pen G.   2:12 PM Chest xray negative. Patient can be discharged without further evaluation.    Emilia Beck, PA-C 12/10/11 1608

## 2011-12-10 NOTE — ED Notes (Signed)
Patient transported to X-ray 

## 2011-12-10 NOTE — ED Provider Notes (Signed)
Medical screening examination/treatment/procedure(s) were performed by non-physician practitioner and as supervising physician I was immediately available for consultation/collaboration.    Leler Brion L Devlyn Retter, MD 12/10/11 2044 

## 2011-12-10 NOTE — ED Notes (Signed)
Pt states was seen earlier for strep throat, received a shot in right arm and now is unable to move it.

## 2011-12-10 NOTE — ED Notes (Signed)
Pt was given penicillin shot in right arm earlier today- back tonight due to extreme amount of pain in his arm. Patient sts that pain is 10/10. Pt able to move right arm, sts that "it just hurts" and he doesn't like to move it.

## 2011-12-10 NOTE — ED Notes (Signed)
Pt states he was seen here earlier today for sore throat - was given IM penicillin shot in his rt deltoid approx 1400 - pt states the nurse told him to put heat on the arm to alleviate the pain, pt states he has however the pain has increased to the point where he is unable to move his rt arm.

## 2011-12-10 NOTE — ED Notes (Signed)
Patient reports a sore throat x 3 days. Patient reports fever and chills, difficulty swallowing.

## 2011-12-11 MED ORDER — IBUPROFEN 600 MG PO TABS: 600.0000 mg | ORAL_TABLET | Freq: Four times a day (QID) | ORAL | Status: DC | PRN

## 2011-12-11 MED ORDER — OXYCODONE-ACETAMINOPHEN 5-325 MG PO TABS: 1.0000 | ORAL_TABLET | ORAL | Status: DC | PRN

## 2011-12-11 NOTE — ED Notes (Signed)
Patient given discharge instructions, information, prescriptions, and diet order. Patient states that they adequately understand discharge information given and to return to ED if symptoms return or worsen.     

## 2011-12-12 NOTE — ED Provider Notes (Signed)
Medical screening examination/treatment/procedure(s) were performed by non-physician practitioner and as supervising physician I was immediately available for consultation/collaboration.   Gwyneth Sprout, MD 12/12/11 2333

## 2011-12-12 NOTE — ED Provider Notes (Signed)
History     CSN: [id number]  Arrival date & time 12/10/11  2106   First MD Initiated Contact with Patient 12/10/11 2329      Chief Complaint  Patient presents with  . Arm Pain    (Consider location/radiation/quality/duration/timing/severity/associated sxs/prior treatment) Patient is a 31 y.o. male presenting with arm pain. The history is provided by the patient. No language interpreter was used.  Arm Pain This is a new problem. The current episode started today. The problem has been unchanged. Associated symptoms include a sore throat and swollen glands. Pertinent negatives include no fever, nausea or vomiting.  31yo male c/o pain at injection site where he received a shot of pcn earlier today.  Has taken nothing for pain. Tried using heat on the site but the pain has worsened and he is unable to use his L arm.    Past Medical History  Diagnosis Date  . HIV (human immunodeficiency virus infection)     History reviewed. No pertinent past surgical history.  Family History  Problem Relation Age of Onset  . Hypertension Mother   . Hypertension Father   . Asthma Sister     History  Substance Use Topics  . Smoking status: Current Every Day Smoker -- 0.5 packs/day for 15 years    Types: Cigarettes  . Smokeless tobacco: Never Used  . Alcohol Use: Yes     Comment: daily /3-4 beers daily      Review of Systems  Constitutional: Negative.  Negative for fever.  HENT: Positive for sore throat.   Eyes: Negative.   Respiratory: Negative.   Cardiovascular: Negative.   Gastrointestinal: Negative.  Negative for nausea and vomiting.  Musculoskeletal:       RUE tenderness to the deltoid muscle  Neurological: Negative.   Psychiatric/Behavioral: Negative.   All other systems reviewed and are negative.    Allergies  Review of patient's allergies indicates no known allergies.  Home Medications   Current Outpatient Rx  Name  Route  Sig  Dispense  Refill  . IBUPROFEN 600 MG PO  TABS   Oral   Take 1 tablet (600 mg total) by mouth every 6 (six) hours as needed for pain.   30 tablet   0   . OXYCODONE-ACETAMINOPHEN 5-325 MG PO TABS   Oral   Take 1 tablet by mouth every 4 (four) hours as needed for pain.   6 tablet   0     BP 103/61  Pulse 63  Temp 98.5 F (36.9 C) (Oral)  Resp 16  SpO2 100%  Physical Exam  Nursing note and vitals reviewed. Constitutional: He is oriented to person, place, and time. He appears well-developed and well-nourished.  HENT:  Head: Normocephalic.  Eyes: Conjunctivae normal and EOM are normal. Pupils are equal, round, and reactive to light.  Neck: Normal range of motion. Neck supple.  Cardiovascular: Normal rate.   Pulmonary/Chest: Effort normal.  Abdominal: Soft.  Musculoskeletal: Normal range of motion. He exhibits tenderness.       R deltoid tender, no rash or inflamation. + cms below the tenderness  Neurological: He is alert and oriented to person, place, and time.  Skin: Skin is warm and dry. No rash noted. No erythema.  Psychiatric: He has a normal mood and affect.    ED Course  Procedures (including critical care time)  Labs Reviewed - No data to display Dg Chest 2 View  12/10/2011  *RADIOLOGY REPORT*  Clinical Data:  Cough, sore throat, congestion,  smoker, HIV  CHEST - 2 VIEW  Comparison: 11/15/2011  Findings: Normal heart size, mediastinal contours, and pulmonary vascularity. Peribronchial thickening without infiltrate, pleural effusion, or pneumothorax. No acute osseous findings.  IMPRESSION: Bronchitic changes.   Original Report Authenticated By: Ulyses Southward, M.D.      1. Pain, arm, right       MDM  R deltoid tenderness where he received a pcn shot earlier today in the ER.  Ibuprofen/ice for pain.  Rx for 6 percocet as well.  He understands to return to ER for fever, inflamation.  Patient is ready for discharge.        Remi Haggard, NP 12/12/11 1155

## 2012-02-11 ENCOUNTER — Telehealth: Payer: Self-pay

## 2012-02-12 ENCOUNTER — Ambulatory Visit: Payer: Self-pay

## 2012-02-13 NOTE — Telephone Encounter (Signed)
Re-schedule appt.

## 2012-02-26 ENCOUNTER — Ambulatory Visit (INDEPENDENT_AMBULATORY_CARE_PROVIDER_SITE_OTHER): Payer: Self-pay

## 2012-02-26 ENCOUNTER — Ambulatory Visit: Payer: Self-pay

## 2012-02-26 DIAGNOSIS — Z8619 Personal history of other infectious and parasitic diseases: Secondary | ICD-10-CM

## 2012-02-26 DIAGNOSIS — I319 Disease of pericardium, unspecified: Secondary | ICD-10-CM

## 2012-02-26 DIAGNOSIS — K4090 Unilateral inguinal hernia, without obstruction or gangrene, not specified as recurrent: Secondary | ICD-10-CM

## 2012-02-26 DIAGNOSIS — R85610 Atypical squamous cells of undetermined significance on cytologic smear of anus (ASC-US): Secondary | ICD-10-CM

## 2012-02-26 DIAGNOSIS — Z79899 Other long term (current) drug therapy: Secondary | ICD-10-CM

## 2012-02-26 DIAGNOSIS — K029 Dental caries, unspecified: Secondary | ICD-10-CM

## 2012-02-26 DIAGNOSIS — B20 Human immunodeficiency virus [HIV] disease: Secondary | ICD-10-CM

## 2012-02-26 DIAGNOSIS — Z113 Encounter for screening for infections with a predominantly sexual mode of transmission: Secondary | ICD-10-CM

## 2012-02-26 LAB — COMPLETE METABOLIC PANEL WITH GFR
ALT: 9 U/L (ref 0–53)
CO2: 28 mEq/L (ref 19–32)
Calcium: 9.2 mg/dL (ref 8.4–10.5)
Chloride: 106 mEq/L (ref 96–112)
Creat: 0.98 mg/dL (ref 0.50–1.35)
GFR, Est African American: >89 mL/min
GFR, Est Non African American: >89 mL/min
Glucose, Bld: 60 mg/dL — ABNORMAL LOW (ref 70–99)
Sodium: 137 mEq/L (ref 135–145)
Total Bilirubin: 0.4 mg/dL (ref 0.3–1.2)
Total Protein: 8.7 g/dL — ABNORMAL HIGH (ref 6.0–8.3)

## 2012-02-26 LAB — LIPID PANEL
Cholesterol: 121 mg/dL (ref 0–200)
HDL: 36 mg/dL — ABNORMAL LOW (ref >39)
Total CHOL/HDL Ratio: 3.4 Ratio
Triglycerides: 72 mg/dL (ref <150)

## 2012-02-27 DIAGNOSIS — I319 Disease of pericardium, unspecified: Secondary | ICD-10-CM | POA: Insufficient documentation

## 2012-02-27 DIAGNOSIS — K029 Dental caries, unspecified: Secondary | ICD-10-CM | POA: Insufficient documentation

## 2012-02-27 DIAGNOSIS — Z23 Encounter for immunization: Secondary | ICD-10-CM

## 2012-02-27 DIAGNOSIS — K4090 Unilateral inguinal hernia, without obstruction or gangrene, not specified as recurrent: Secondary | ICD-10-CM | POA: Insufficient documentation

## 2012-02-27 DIAGNOSIS — Z8619 Personal history of other infectious and parasitic diseases: Secondary | ICD-10-CM | POA: Insufficient documentation

## 2012-02-27 DIAGNOSIS — B20 Human immunodeficiency virus [HIV] disease: Secondary | ICD-10-CM | POA: Insufficient documentation

## 2012-02-27 DIAGNOSIS — R85610 Atypical squamous cells of undetermined significance on cytologic smear of anus (ASC-US): Secondary | ICD-10-CM | POA: Insufficient documentation

## 2012-02-27 LAB — CBC WITH DIFFERENTIAL/PLATELET
Eosinophils Absolute: 0 10*3/uL (ref 0.0–0.7)
Eosinophils Relative: 2 % (ref 0–5)
Hemoglobin: 12.8 g/dL — ABNORMAL LOW (ref 13.0–17.0)
Lymphocytes Relative: 49 % — ABNORMAL HIGH (ref 12–46)
Lymphs Abs: 1.2 10*3/uL (ref 0.7–4.0)
MCH: 31.5 pg (ref 26.0–34.0)
MCV: 92.9 fL (ref 78.0–100.0)
Monocytes Relative: 12 % (ref 3–12)
Platelets: 108 10*3/uL — ABNORMAL LOW (ref 150–400)
RBC: 4.06 MIL/uL — ABNORMAL LOW (ref 4.22–5.81)
WBC: 2.4 10*3/uL — ABNORMAL LOW (ref 4.0–10.5)

## 2012-02-27 LAB — URINALYSIS
Glucose, UA: NEGATIVE mg/dL
Nitrite: NEGATIVE
Specific Gravity, Urine: 1.023 (ref 1.005–1.030)
pH: 6 (ref 5.0–8.0)

## 2012-02-27 LAB — HIV-1 RNA ULTRAQUANT REFLEX TO GENTYP+: HIV 1 RNA Quant: 3416 copies/mL — ABNORMAL HIGH (ref <20)

## 2012-02-27 LAB — HEPATITIS A ANTIBODY, TOTAL: Hep A Total Ab: NEGATIVE

## 2012-02-27 LAB — T-HELPER CELL (CD4) - (RCID CLINIC ONLY): CD4 % Helper T Cell: 36 % (ref 33–55)

## 2012-02-27 LAB — PATHOLOGIST SMEAR REVIEW

## 2012-02-27 NOTE — Progress Notes (Signed)
Pt was previously in study at [facility] ([employer] ) 4-12 he was discharged from study due to inability to make scheduled visits.  He has not been on medications since study.  He is currently seeking employment.    He has complaint of left groin pain and states he has a hernia that is reducible since age 32.  Pain is usually intermittent. He would like to have a surgical referral since pain has increased in frequency and severity.  Pt states history of "inflammation around his heart" from a fall a few years ago. He has occasional shortness of breath and chest discomfort.   Dental Caries and several teeth with fillings missing which cause pain.   Laurell Josephs, RN

## 2012-03-05 LAB — HIV-1 GENOTYPR PLUS

## 2012-03-13 ENCOUNTER — Ambulatory Visit: Payer: Self-pay

## 2012-03-13 ENCOUNTER — Encounter: Payer: Self-pay | Admitting: Internal Medicine

## 2012-03-13 ENCOUNTER — Other Ambulatory Visit: Payer: Self-pay | Admitting: Internal Medicine

## 2012-03-13 ENCOUNTER — Ambulatory Visit (INDEPENDENT_AMBULATORY_CARE_PROVIDER_SITE_OTHER): Payer: Self-pay | Admitting: Internal Medicine

## 2012-03-13 VITALS — BP 115/73 | HR 69 | Temp 97.8°F | Ht 70.0 in | Wt 135.0 lb

## 2012-03-13 DIAGNOSIS — B20 Human immunodeficiency virus [HIV] disease: Secondary | ICD-10-CM

## 2012-03-13 DIAGNOSIS — K029 Dental caries, unspecified: Secondary | ICD-10-CM

## 2012-03-13 DIAGNOSIS — Z21 Asymptomatic human immunodeficiency virus [HIV] infection status: Secondary | ICD-10-CM

## 2012-03-13 DIAGNOSIS — R85610 Atypical squamous cells of undetermined significance on cytologic smear of anus (ASC-US): Secondary | ICD-10-CM

## 2012-03-13 DIAGNOSIS — Z23 Encounter for immunization: Secondary | ICD-10-CM

## 2012-03-13 MED ORDER — TRAZODONE HCL 50 MG PO TABS: 50.0000 mg | ORAL_TABLET | Freq: Every day | ORAL | Status: DC

## 2012-03-13 MED ORDER — ELVITEG-COBIC-EMTRICIT-TENOFDF 150-150-200-300 MG PO TABS: 1.0000 | ORAL_TABLET | Freq: Every day | ORAL | Status: DC

## 2012-03-13 NOTE — Addendum Note (Signed)
Addended by: Andree Coss on: 03/13/2012 11:37 AM   Modules accepted: Orders

## 2012-03-13 NOTE — Progress Notes (Signed)
  Subjective:    Patient ID: Frank Stark, male    DOB: 1980-12-18, 32 y.o.   MRN: [id number]  HPI He comes in as a new patient with HIV. He previously was diagnosed in 2012 and went to [facility]. He was put on a study protocol with the return of ear, Norvir and Truvada. He took these medications for several months however due to his schedule he had sporadic followup and stopped medicines in about July of 2012. He has had no significant problems since other than some complaints of his inguinal hernia that otherwise has done well. He comes in here today since he is in school in [place] and is interested in reinitiating therapy. He has no complaints and no particular questions regarding HIV. He is eager to start medications daily and feels he will be compliant. He does endorse MSM. He does tell me he uses condoms every time. He smokes marijuana but no other drug use. He does smoke cigarettes daily.   Review of Systems  Constitutional: Negative for fever, chills, activity change, appetite change, fatigue and unexpected weight change.  HENT: Negative for sore throat and trouble swallowing.   Respiratory: Negative for cough and shortness of breath.   Cardiovascular: Negative for chest pain, palpitations and leg swelling.  Gastrointestinal: Negative for nausea, abdominal pain and diarrhea.  Musculoskeletal: Negative for myalgias, joint swelling and arthralgias.  Skin: Negative for rash.  Neurological: Negative for dizziness and headaches.  Psychiatric/Behavioral: Positive for dysphoric mood. Negative for suicidal ideas. The patient is not nervous/anxious.        Objective:   Physical Exam  Constitutional: He appears well-developed and well-nourished. No distress.  Cardiovascular: Normal rate, regular rhythm and normal heart sounds.  Exam reveals no gallop and no friction rub.   No murmur heard. Pulmonary/Chest: Effort normal and breath sounds normal. No respiratory  distress. He has no wheezes. He has no rales.  Abdominal: Soft. Bowel sounds are normal. He exhibits no distension. There is no tenderness. There is no rebound.  Lymphadenopathy:    He has no cervical adenopathy.  Skin: No rash noted.  Psychiatric: He has a normal mood and affect. His behavior is normal.          Assessment & Plan:

## 2012-03-13 NOTE — Assessment & Plan Note (Signed)
She will be scheduled for the dental clinic

## 2012-03-13 NOTE — Patient Instructions (Signed)
Start your Stribild when you receive it and take daily

## 2012-03-13 NOTE — Assessment & Plan Note (Signed)
He will need followup for anal Pap smear

## 2012-03-13 NOTE — Assessment & Plan Note (Addendum)
I did discuss options with him and has opted for Stribild. His initial genotype is wild type with no resistance. His CD4 is 4:30. He will start on his medications when he has excess and I will check his blood tests about 4 weeks later and he will follow up with me one to 2 weeks after the blood test. I did discuss long-term side effects as well short term. I did tell him to call if he has any problems with the medication and to not stop without discussing with [place]. He was offered and given condoms. He will return in 2 months for follow up of his labs  More than 60 minutes was spent with face-to-face contact, counseling and coordination of care and access to medication

## 2012-03-28 ENCOUNTER — Other Ambulatory Visit: Payer: Self-pay | Admitting: *Deleted

## 2012-03-28 DIAGNOSIS — B20 Human immunodeficiency virus [HIV] disease: Secondary | ICD-10-CM

## 2012-03-28 MED ORDER — ELVITEG-COBIC-EMTRICIT-TENOFDF 150-150-200-300 MG PO TABS: 1.0000 | ORAL_TABLET | Freq: Every day | ORAL | Status: DC

## 2012-03-31 ENCOUNTER — Telehealth: Payer: Self-pay

## 2012-03-31 NOTE — Telephone Encounter (Signed)
Called patient to let him know he was adap approved and to call [facility] regarding setting up delivery/p/u for meds - phone number vm states patient not accepting incoming calls.

## 2012-04-09 ENCOUNTER — Other Ambulatory Visit: Payer: Self-pay | Admitting: Licensed Clinical Social Worker

## 2012-04-09 DIAGNOSIS — G4700 Insomnia, unspecified: Secondary | ICD-10-CM

## 2012-04-09 DIAGNOSIS — B20 Human immunodeficiency virus [HIV] disease: Secondary | ICD-10-CM

## 2012-04-09 MED ORDER — ELVITEG-COBIC-EMTRICIT-TENOFDF 150-150-200-300 MG PO TABS: 1.0000 | ORAL_TABLET | Freq: Every day | ORAL | Status: DC

## 2012-04-09 MED ORDER — TRAZODONE HCL 50 MG PO TABS: 50.0000 mg | ORAL_TABLET | Freq: Every day | ORAL | Status: DC

## 2012-04-17 ENCOUNTER — Telehealth: Payer: Self-pay | Admitting: *Deleted

## 2012-04-17 NOTE — Telephone Encounter (Signed)
Problems with ADAP delayed obtaining Stribild rx.  Started taking 2 days ago.  Started having watery diarrhea after first dose of Stribild.  RN advised pt to start OTC Imodium per the package instructions.  RN requested that pt call [facility] back on Monday, March 17 to let us know how the Imodium is working.  Pt verbalized Imodium instructions and to cal back on Monday.

## 2012-04-17 NOTE — Telephone Encounter (Signed)
If needed he can also have imodium

## 2012-04-21 ENCOUNTER — Telehealth: Payer: Self-pay | Admitting: *Deleted

## 2012-04-21 ENCOUNTER — Other Ambulatory Visit: Payer: Self-pay | Admitting: *Deleted

## 2012-04-21 NOTE — Telephone Encounter (Signed)
Attempted to call patient at all listed phone numbers, none are working.  Patient is ADAP approved, need to know where he would like the prescriptions sent. Andree Coss, RN

## 2012-05-01 ENCOUNTER — Telehealth: Payer: Self-pay | Admitting: *Deleted

## 2012-05-01 NOTE — Telephone Encounter (Signed)
Obtained working phone number for pt from [employer].  Message left requesting pt call [facility] for lab and MD appts.  Also, asked how the pt's diarrhea had been helped by trying Imodium.

## 2012-05-15 ENCOUNTER — Telehealth: Payer: Self-pay | Admitting: *Deleted

## 2012-05-15 NOTE — Telephone Encounter (Signed)
Patient called stating that someone he had sex with was notified by the [facility] that he had a bacterial infection. Patient has noted discharge from his penis. Advised he needs to go to the [facility] for treatment. Frank Stark

## 2012-06-01 ENCOUNTER — Emergency Department (HOSPITAL_COMMUNITY): Payer: Self-pay

## 2012-06-01 ENCOUNTER — Encounter (HOSPITAL_COMMUNITY): Payer: Self-pay | Admitting: *Deleted

## 2012-06-01 ENCOUNTER — Emergency Department (HOSPITAL_COMMUNITY)
Admission: EM | Admit: 2012-06-01 | Discharge: 2012-06-01 | Disposition: A | Discharge status: Home / Self Care | Payer: Self-pay | Attending: Emergency Medicine | Admitting: Emergency Medicine

## 2012-06-01 DIAGNOSIS — Z79899 Other long term (current) drug therapy: Secondary | ICD-10-CM | POA: Insufficient documentation

## 2012-06-01 DIAGNOSIS — M5432 Sciatica, left side: Secondary | ICD-10-CM

## 2012-06-01 DIAGNOSIS — Z21 Asymptomatic human immunodeficiency virus [HIV] infection status: Secondary | ICD-10-CM | POA: Insufficient documentation

## 2012-06-01 DIAGNOSIS — M25519 Pain in unspecified shoulder: Secondary | ICD-10-CM | POA: Insufficient documentation

## 2012-06-01 DIAGNOSIS — F17200 Nicotine dependence, unspecified, uncomplicated: Secondary | ICD-10-CM | POA: Insufficient documentation

## 2012-06-01 DIAGNOSIS — M5430 Sciatica, unspecified side: Secondary | ICD-10-CM | POA: Insufficient documentation

## 2012-06-01 NOTE — ED Notes (Signed)
Left lower leg numbness since yesterday morning. X 2 weeks of pain in lt. Foot - when stepping down - feels like needles. No back pain.

## 2012-06-01 NOTE — ED Provider Notes (Signed)
History     CSN: [id number]  Arrival date & time 06/01/12  0208   First MD Initiated Contact with Patient 06/01/12 [phone]      Chief Complaint  Patient presents with  . Numbness    (Consider location/radiation/quality/duration/timing/severity/associated sxs/prior treatment) HPI  32 year old male presents emergency Department with chief complaint of paresthesia.his past medical history of HIV and is currently on antiviral medications. Patient does not know his viral load or CD4 count. Patient states he began having numbness and pain laying about 2 days he tells in the left shoulder arm and thorax. He also had numbness and healing down the backside of his left leg extending through the posterior calf into the bottom of the foot. Patient also has some left buttock pain. He has never had this symptom before. He denies any previous injuries to his back or lower back pain. Patient states that the paresthesia is worse when he stopped on the leg. He denies any weakness in the leg.he has minimal pain. Denies weakness, loss of bowel/bladder function or saddle anesthesia. Denies neck stiffness, headache, rash.  Denies fever or recent procedures to back. Denies fevers, chills, myalgias, arthralgias. Denies DOE, SOB, chest tightness or pressure, radiation to left arm, jaw or back, or diaphoresis. Denies dysuria, flank pain, suprapubic pain, frequency, urgency, or hematuria. Denies headaches, light headedness, weakness, visual disturbances. Denies abdominal pain, nausea, vomiting, diarrhea or constipation.   Past Medical History  Diagnosis Date  . HIV (human immunodeficiency virus infection)     History reviewed. No pertinent past surgical history.  Family History  Problem Relation Age of Onset  . Hypertension Mother   . Hypertension Father   . Asthma Sister   . Diabetes Maternal Grandmother   . Dementia Maternal Grandmother   . Glaucoma Maternal Grandfather     History  Substance Use Topics   . Smoking status: Current Every Day Smoker -- 0.50 packs/day for 10 years    Types: Cigarettes  . Smokeless tobacco: Never Used  . Alcohol Use: Yes     Comment: daily /3-4 beer every twice a week       Review of Systems  Ten systems reviewed and are negative for acute change, except as noted in the HPI.   Allergies  Review of patient's allergies indicates no known allergies.  Home Medications   Current Outpatient Rx  Name  Route  Sig  Dispense  Refill  . elvitegravir-cobicistat-emtricitabine-tenofovir (STRIBILD) 150-150-200-300 MG TABS   Oral   Take 1 tablet by mouth daily with breakfast.   30 tablet   11   . traZODone (DESYREL) 50 MG tablet   Oral   Take 1 tablet (50 mg total) by mouth at bedtime. For sleep   30 tablet   2     BP 105/77  Pulse 80  Temp(Src) 98.3 F (36.8 C) (Oral)  Resp 18  SpO2 96%  Physical Exam  Nursing note and vitals reviewed. Constitutional: He is oriented to person, place, and time. He appears well-developed and well-nourished. No distress.  HENT:  Head: Normocephalic and atraumatic.  Eyes: Conjunctivae are normal. No scleral icterus.  Neck: Normal range of motion. Neck supple.  Cardiovascular: Normal rate, regular rhythm and normal heart sounds.   Pulmonary/Chest: Effort normal and breath sounds normal. No respiratory distress.  Abdominal: Soft. There is no tenderness.  Musculoskeletal: He exhibits no edema.  Full range of motion of the back. He has a positive straight leg raise on the left side. Normal sensation  and DTRs. Sensation is normal to light.  Neurological: He is alert and oriented to person, place, and time. He has normal reflexes. No cranial nerve deficit.  Speech is clear and goal oriented, follows commands Major Cranial nerves without deficit, no facial droop Normal strength in upper and lower extremities bilaterally including dorsiflexion and plantar flexion, strong and equal grip strength Sensation normal to light  and sharp touch Moves extremities without ataxia, coordination intact Normal finger to nose and rapid alternating movements    Skin: Skin is warm and dry. He is not diaphoretic.  Psychiatric: His behavior is normal.    ED Course  Procedures (including critical care time)  Labs Reviewed - No data to display Dg Lumbar Spine Complete  06/01/2012  *RADIOLOGY REPORT*  Clinical Data: Paresthesia in the left foot and numbness in the left leg.  History of 09/1968.  Chronic low back muscle spasms.  LUMBAR SPINE - COMPLETE 4+ VIEW  Comparison: 04/29/2010  Findings: Five lumbar type vertebrae.  Mild lumbar curvature convex towards the left may be positional.  No abnormal anterior subluxation of the lumbar vertebrae.  Lumbar facet joints demonstrate normal alignment.  No vertebral compression deformities.  Intervertebral disc space heights are preserved.  No focal bone lesion or bone destruction.  Bone cortex and trabecular architecture appear intact.  Stable appearance since previous study.  IMPRESSION: No displaced fractures or focal bone lesions identified in the lumbar spine.   Original Report Authenticated By: Burman Nieves, M.D.      1. Sciatica neuralgia, left       MDM  6:48 AM Filed Vitals:   06/01/12 0213  BP: 105/77  Pulse: 80  Temp: 98.3 F (36.8 C)  Resp: 18   Patient with new onset sciatic pain. He has a negative lumbar x-ray today. X-ray was viewed by me personally. Patient cannot take NSAIDs as they interact with his antiviral medications. Patient is to ice low back and buttocks 3 x  A day and follow up with ID/ Ortho. Patient with back pain.  No neurological deficits and normal neuro exam.  Patient can walk but states is painful.  No loss of bowel or bladder control.  No concern for cauda equina.  No fever, night sweats, weight loss, h/o cancer, IVDU.  RICE protocol and pain medicine indicated and discussed with patient.          Arthor Captain, PA-C 06/01/12 [phone]

## 2012-06-01 NOTE — ED Provider Notes (Signed)
Medical screening examination/treatment/procedure(s) were performed by non-physician practitioner and as supervising physician I was immediately available for consultation/collaboration.  Jasmine Awe, MD 06/01/12 [phone]

## 2012-06-01 NOTE — ED Notes (Signed)
Pt is currently denying any pain.  Pt states at worse pain was 10/10 and described as numbness and tingling.  CMS intact.

## 2012-06-03 ENCOUNTER — Ambulatory Visit: Payer: Self-pay | Admitting: Infectious Diseases

## 2012-06-04 ENCOUNTER — Other Ambulatory Visit (INDEPENDENT_AMBULATORY_CARE_PROVIDER_SITE_OTHER): Payer: Self-pay

## 2012-06-04 ENCOUNTER — Other Ambulatory Visit: Payer: Self-pay | Admitting: Internal Medicine

## 2012-06-04 DIAGNOSIS — B20 Human immunodeficiency virus [HIV] disease: Secondary | ICD-10-CM

## 2012-06-05 LAB — CBC WITH DIFFERENTIAL/PLATELET
Lymphocytes Relative: 56 % — ABNORMAL HIGH (ref 12–46)
Lymphs Abs: 1.2 10*3/uL (ref 0.7–4.0)
MCV: 93.7 fL (ref 78.0–100.0)
Neutrophils Relative %: 32 % — ABNORMAL LOW (ref 43–77)
Platelets: 122 10*3/uL — ABNORMAL LOW (ref 150–400)
RBC: 4.14 MIL/uL — ABNORMAL LOW (ref 4.22–5.81)
WBC: 2.2 10*3/uL — ABNORMAL LOW (ref 4.0–10.5)

## 2012-06-05 LAB — COMPLETE METABOLIC PANEL WITH GFR
AST: 17 U/L (ref 0–37)
BUN: 18 mg/dL (ref 6–23)
Calcium: 9 mg/dL (ref 8.4–10.5)
Chloride: 106 mEq/L (ref 96–112)
Creat: 1.11 mg/dL (ref 0.50–1.35)
GFR, Est African American: >89 mL/min
Total Bilirubin: 0.3 mg/dL (ref 0.3–1.2)

## 2012-06-05 LAB — HIV-1 RNA QUANT-NO REFLEX-BLD: HIV 1 RNA Quant: <20 copies/mL (ref <20)

## 2012-06-05 LAB — T-HELPER CELL (CD4) - (RCID CLINIC ONLY): CD4 T Cell Abs: 450 uL (ref 400–2700)

## 2012-06-18 ENCOUNTER — Telehealth: Payer: Self-pay | Admitting: *Deleted

## 2012-06-18 NOTE — Telephone Encounter (Signed)
Pt c/o "sciatic pain/numbness/tingling" on opposite side from when he was seen in the ED 05/2712.  Worried that the pain may be caused by his HIV medications.  Pt has an appointment 06/24/12 @ 10:30 w/ [facility] MD.  RN requested that the pt keep a log of his symptoms including when, where, any preceding events and how long.  Pt agreed to keep log and bring to visit.

## 2012-06-24 ENCOUNTER — Ambulatory Visit: Payer: Self-pay | Admitting: Internal Medicine

## 2012-06-24 ENCOUNTER — Telehealth: Payer: Self-pay | Admitting: *Deleted

## 2012-06-24 NOTE — Telephone Encounter (Signed)
Phone does not accept incoming calls.  Will refer to [facility]. Andree Coss, RN

## 2012-07-05 ENCOUNTER — Encounter (HOSPITAL_COMMUNITY): Payer: Self-pay | Admitting: Family Medicine

## 2012-07-05 ENCOUNTER — Emergency Department (HOSPITAL_COMMUNITY)
Admission: EM | Admit: 2012-07-05 | Discharge: 2012-07-06 | Disposition: A | Discharge status: Home / Self Care | Payer: Self-pay | Attending: Emergency Medicine | Admitting: Emergency Medicine

## 2012-07-05 DIAGNOSIS — R197 Diarrhea, unspecified: Secondary | ICD-10-CM | POA: Insufficient documentation

## 2012-07-05 DIAGNOSIS — F17200 Nicotine dependence, unspecified, uncomplicated: Secondary | ICD-10-CM | POA: Insufficient documentation

## 2012-07-05 DIAGNOSIS — R112 Nausea with vomiting, unspecified: Secondary | ICD-10-CM | POA: Insufficient documentation

## 2012-07-05 DIAGNOSIS — Z79899 Other long term (current) drug therapy: Secondary | ICD-10-CM | POA: Insufficient documentation

## 2012-07-05 DIAGNOSIS — Z21 Asymptomatic human immunodeficiency virus [HIV] infection status: Secondary | ICD-10-CM | POA: Insufficient documentation

## 2012-07-05 LAB — LIPASE, BLOOD: Lipase: 26 U/L (ref 11–59)

## 2012-07-05 LAB — CBC WITH DIFFERENTIAL/PLATELET
Basophils Absolute: 0 10*3/uL (ref 0.0–0.1)
Eosinophils Absolute: 0 10*3/uL (ref 0.0–0.7)
Eosinophils Relative: 1 % (ref 0–5)
Lymphs Abs: 1.7 10*3/uL (ref 0.7–4.0)
MCH: 32.2 pg (ref 26.0–34.0)
MCHC: 34.7 g/dL (ref 30.0–36.0)
MCV: 92.7 fL (ref 78.0–100.0)
Monocytes Absolute: 0.4 10*3/uL (ref 0.1–1.0)
Platelets: 128 10*3/uL — ABNORMAL LOW (ref 150–400)
RDW: 13.2 % (ref 11.5–15.5)

## 2012-07-05 LAB — COMPREHENSIVE METABOLIC PANEL
Albumin: 3.9 g/dL (ref 3.5–5.2)
BUN: 24 mg/dL — ABNORMAL HIGH (ref 6–23)
Chloride: 102 mEq/L (ref 96–112)
Creatinine, Ser: 1.04 mg/dL (ref 0.50–1.35)
GFR calc non Af Amer: >90 mL/min (ref >90)
Total Bilirubin: 0.2 mg/dL — ABNORMAL LOW (ref 0.3–1.2)

## 2012-07-05 MED ORDER — ONDANSETRON HCL 4 MG/2ML IJ SOLN
4.0000 mg | Freq: Once | INTRAMUSCULAR | Status: AC
  Administered 2012-07-05: 4 mg via INTRAVENOUS
  Filled 2012-07-05: qty 2

## 2012-07-05 MED ORDER — SODIUM CHLORIDE 0.9 % IV BOLUS (SEPSIS)
1000.0000 mL | Freq: Once | INTRAVENOUS | Status: AC
  Administered 2012-07-05: 1000 mL via INTRAVENOUS

## 2012-07-05 MED ORDER — MORPHINE SULFATE 4 MG/ML IJ SOLN
4.0000 mg | Freq: Once | INTRAMUSCULAR | Status: AC
  Administered 2012-07-05: 4 mg via INTRAVENOUS
  Filled 2012-07-05: qty 1

## 2012-07-05 NOTE — ED Notes (Addendum)
Patient states that he thinks he has a stomach bug or a reaction to medication. States he recently stopped Amoxicillin as well. States he has had a cold and hot spells. Has not eaten in the past 2 days and everything he drinks either he vomits or has watery diarrhea.

## 2012-07-05 NOTE — ED Provider Notes (Signed)
History     CSN: [id number]  Arrival date & time 07/05/12  2002   First MD Initiated Contact with Patient 07/05/12 2027      Chief Complaint  Patient presents with  . Emesis  . Diarrhea    (Consider location/radiation/quality/duration/timing/severity/associated sxs/prior treatment) HPI  32 year old male with history of HIV presents complaining of nausea vomiting and diarrhea. Patient reports for the past 3 days he he has had persistent nausea, vomiting, and diarrhea. Has had 5-10 bouts of nonbloody, non-bilious vomiting, and more than 10 bouts of nonbloody, non-mucousy diarrhea per day. States he cannot keep anything down although he has appetite. He can drink Gatorade only. Patient feels dehydrated. He had occasional hot and cold spells. He denies any fever, chills, headache, chest pain, shortness of breath, cough, abdominal pain, back pain, dysuria, or rash. He had a dental extraction 2 weeks ago and was on amoxicillin for 10 days, finished 3 days ago. He has a history of HIV currently taking Stribild. He is unsure of his CD4 count or viral load.  Past Medical History  Diagnosis Date  . HIV (human immunodeficiency virus infection)     History reviewed. No pertinent past surgical history.  Family History  Problem Relation Age of Onset  . Hypertension Mother   . Hypertension Father   . Asthma Sister   . Diabetes Maternal Grandmother   . Dementia Maternal Grandmother   . Glaucoma Maternal Grandfather     History  Substance Use Topics  . Smoking status: Current Every Day Smoker -- 1.00 packs/day for 10 years    Types: Cigarettes  . Smokeless tobacco: Never Used  . Alcohol Use: Yes     Comment: daily /3-4 beer every twice a week       Review of Systems  Constitutional:       A complete 10 system review of systems was obtained and all systems are negative except as noted in the HPI and PMH.    Allergies  Review of patient's allergies indicates no known  allergies.  Home Medications   Current Outpatient Rx  Name  Route  Sig  Dispense  Refill  . amoxicillin (AMOXIL) 500 MG capsule   Oral   Take 500 mg by mouth 3 (three) times daily.         Marland Kitchen elvitegravir-cobicistat-emtricitabine-tenofovir (STRIBILD) 150-150-200-300 MG TABS   Oral   Take 1 tablet by mouth daily with breakfast.   30 tablet   11     BP 110/65  Pulse 85  Temp(Src) 98.6 F (37 C) (Oral)  Resp 20  Ht 5\' 11"  (1.803 m)  Wt 136 lb (61.689 kg)  BMI 18.98 kg/m2  SpO2 97%  Physical Exam  Nursing note and vitals reviewed. Constitutional: He is oriented to person, place, and time. He appears well-developed and well-nourished. No distress.  Awake, alert, nontoxic appearance  HENT:  Head: Atraumatic.  Eyes: Conjunctivae are normal. Right eye exhibits no discharge. Left eye exhibits no discharge.  Neck: Normal range of motion. Neck supple.  Cardiovascular: Normal rate and regular rhythm.   Pulmonary/Chest: Effort normal. No respiratory distress. He exhibits no tenderness.  Abdominal: Soft. There is no tenderness. There is no rebound.  Abdomen soft nontender on exam  Musculoskeletal: He exhibits no tenderness.  ROM appears intact, no obvious focal weakness  Neurological: He is alert and oriented to person, place, and time.  Skin: Skin is warm and dry. No rash noted.  Psychiatric: He has a normal mood and  affect.    ED Course  Procedures (including critical care time)  9:33 PM Patient presents with nausea vomiting and diarrhea. He has a nonsurgical abdomen. Is afebrile with stable normal vital sign. He appears nontoxic. Will check labs, IV fluid given, antinausea and pain medication given. Although this may be antibiotic related diarrhea I do not suspect c.diff infection at this time.  11:47 PM Patient's electrolytes appears to be normal. The patient is afebrile with stable normal vital sign. Patient is currently receiving IV fluid. He felt better after receiving  antinausea medication. Continues to have bowel movement. Patient is stable for discharge once he receives his IV fluid. Patient agrees to followup with his infectious disease specialist for further management.   Labs Reviewed  CBC WITH DIFFERENTIAL - Abnormal; Notable for the following:    WBC 2.9 (*)    Platelets 128 (*)    Neutrophils Relative % 26 (*)    Lymphocytes Relative 59 (*)    Monocytes Relative 13 (*)    Neutro Abs 0.8 (*)    All other components within normal limits  COMPREHENSIVE METABOLIC PANEL - Abnormal; Notable for the following:    BUN 24 (*)    Total Protein 9.1 (*)    Total Bilirubin 0.2 (*)    All other components within normal limits  LIPASE, BLOOD  PATHOLOGIST SMEAR REVIEW   No results found.   1. Nausea vomiting and diarrhea       MDM  BP 110/65  Pulse 85  Temp(Src) 98.6 F (37 C) (Oral)  Resp 20  Ht 5\' 11"  (1.803 m)  Wt 136 lb (61.689 kg)  BMI 18.98 kg/m2  SpO2 97%          Fayrene Helper, PA-C 07/06/12 [phone]

## 2012-07-06 MED ORDER — LOPERAMIDE HCL 2 MG PO CAPS
2.0000 mg | ORAL_CAPSULE | Freq: Four times a day (QID) | ORAL | Status: DC | PRN
Start: 2012-07-06 — End: 2013-05-19

## 2012-07-06 MED ORDER — ONDANSETRON HCL 4 MG PO TABS: 4.0000 mg | ORAL_TABLET | Freq: Four times a day (QID) | ORAL | Status: DC

## 2012-07-06 NOTE — ED Notes (Signed)
MD at bedside. 

## 2012-07-07 ENCOUNTER — Telehealth: Payer: Self-pay

## 2012-07-07 NOTE — Telephone Encounter (Signed)
Pt states he has a stomach virus diagnosed at the ED.  He has not been able to keep anything down( foods and drinks) so he has not taken his medication. He is calling to see what he should do.   He last dose of medication that stayed down was Wednesday , last week.  He is also having watery diarrhea.  I do not think it is associated with the medication since he has been taking this for a while and symptoms not associated with dosing of medication t was advised to try medication in the morning with one sip of water and crackers to see if it stays down.  If he vomits after taking medication he was advised to keep scheduled appointment for July 09, 2012 and discuss situation with the physician.    Laurell Josephs, RN

## 2012-07-09 ENCOUNTER — Ambulatory Visit (INDEPENDENT_AMBULATORY_CARE_PROVIDER_SITE_OTHER): Payer: Self-pay | Admitting: Internal Medicine

## 2012-07-09 ENCOUNTER — Encounter: Payer: Self-pay | Admitting: Internal Medicine

## 2012-07-09 VITALS — BP 115/69 | HR 82 | Temp 98.6°F | Ht 70.0 in | Wt 125.0 lb

## 2012-07-09 DIAGNOSIS — Z21 Asymptomatic human immunodeficiency virus [HIV] infection status: Secondary | ICD-10-CM

## 2012-07-09 DIAGNOSIS — M5430 Sciatica, unspecified side: Secondary | ICD-10-CM | POA: Insufficient documentation

## 2012-07-09 DIAGNOSIS — B20 Human immunodeficiency virus [HIV] disease: Secondary | ICD-10-CM

## 2012-07-09 DIAGNOSIS — M5432 Sciatica, left side: Secondary | ICD-10-CM

## 2012-07-09 NOTE — Assessment & Plan Note (Signed)
He is doing well with his Stribild with good viral suppression. I will however since he has been off for several days, have him return in about 2 months for repeat of his labs.

## 2012-07-09 NOTE — Assessment & Plan Note (Signed)
Symptoms he describes it is concerning for sciatica. I did offer physical therapy referral she will wait at this time. No concerning signs including no bowel or bladder dysfunction.

## 2012-07-09 NOTE — Progress Notes (Signed)
  Subjective:    Patient ID: Frank Stark, male    DOB: Dec 05, 1980, 32 y.o.   MRN: [id number]  HPI He comes in for followup of his HIV. After being out of care, he returned earlier this year and was started on Stribild. He previously was on Atripla but had been off for about 2 years. He has been compliant with his medication though does tell me he's had difficulty swallowing. He denies any missed doses previously though. However over the last week, he has had difficulty with nausea, vomiting and diarrhea. He did go the emergency room and got symptomatic care and he is now feeling better. Unfortunately he has not been able to his medicine until today. He does feel debilitated now though and does continue to have better. He did have a sick contact. No fever. In the emergency room there is no concerns on any labs. His last CD4 count was 430 and his viral load was undetectable prior to him getting sick. Continues to have nausea but no vomiting. He continues to have diarrhea. All nonbloody.   Review of Systems  Constitutional: Negative for fever and fatigue.  HENT: Negative for sore throat and trouble swallowing.   Respiratory: Negative for shortness of breath.   Cardiovascular: Negative for leg swelling.  Gastrointestinal: Positive for nausea and diarrhea. Negative for vomiting and abdominal pain.  Musculoskeletal:       Left leg nerve pain that goes down to his foot  Skin: Negative for rash.  Neurological: Negative for dizziness and headaches.       Objective:   Physical Exam  Constitutional: He appears well-developed and well-nourished. No distress.  HENT:  Mouth/Throat: Oropharynx is clear and moist. No oropharyngeal exudate.  Cardiovascular: Normal rate, regular rhythm and normal heart sounds.   No murmur heard. Pulmonary/Chest: Effort normal and breath sounds normal. No respiratory distress. He has no wheezes.  Abdominal: Soft. Bowel sounds are normal. He exhibits no distension. There  is no tenderness. There is no rebound.  Lymphadenopathy:    He has no cervical adenopathy.  Skin: No rash noted.          Assessment & Plan:

## 2012-07-10 NOTE — ED Provider Notes (Signed)
Medical screening examination/treatment/procedure(s) were performed by non-physician practitioner and as supervising physician I was immediately available for consultation/collaboration.  Gayna Braddy, MD 07/10/12 0510 

## 2012-09-08 ENCOUNTER — Ambulatory Visit: Payer: Self-pay

## 2012-09-08 ENCOUNTER — Other Ambulatory Visit (INDEPENDENT_AMBULATORY_CARE_PROVIDER_SITE_OTHER): Payer: Self-pay

## 2012-09-08 DIAGNOSIS — B20 Human immunodeficiency virus [HIV] disease: Secondary | ICD-10-CM

## 2012-09-08 LAB — COMPLETE METABOLIC PANEL WITH GFR
AST: 20 U/L (ref 0–37)
Alkaline Phosphatase: 81 U/L (ref 39–117)
BUN: 19 mg/dL (ref 6–23)
Creat: 1.26 mg/dL (ref 0.50–1.35)
Potassium: 4 mEq/L (ref 3.5–5.3)

## 2012-09-09 LAB — CBC WITH DIFFERENTIAL/PLATELET
Eosinophils Relative: 2 % (ref 0–5)
HCT: 39.7 % (ref 39.0–52.0)
Hemoglobin: 13.8 g/dL (ref 13.0–17.0)
Lymphocytes Relative: 62 % — ABNORMAL HIGH (ref 12–46)
Lymphs Abs: 1.9 10*3/uL (ref 0.7–4.0)
MCV: 94.1 fL (ref 78.0–100.0)
Monocytes Absolute: 0.3 10*3/uL (ref 0.1–1.0)
RBC: 4.22 MIL/uL (ref 4.22–5.81)
WBC: 3.1 10*3/uL — ABNORMAL LOW (ref 4.0–10.5)

## 2012-09-09 LAB — HIV-1 RNA QUANT-NO REFLEX-BLD: HIV 1 RNA Quant: <20 copies/mL (ref <20)

## 2012-09-23 ENCOUNTER — Encounter: Payer: Self-pay | Admitting: Internal Medicine

## 2012-09-23 ENCOUNTER — Ambulatory Visit (INDEPENDENT_AMBULATORY_CARE_PROVIDER_SITE_OTHER): Payer: Self-pay | Admitting: Internal Medicine

## 2012-09-23 VITALS — BP 102/66 | HR 66 | Temp 98.4°F | Ht 68.0 in | Wt 129.0 lb

## 2012-09-23 DIAGNOSIS — Z21 Asymptomatic human immunodeficiency virus [HIV] infection status: Secondary | ICD-10-CM

## 2012-09-23 DIAGNOSIS — K029 Dental caries, unspecified: Secondary | ICD-10-CM

## 2012-09-23 DIAGNOSIS — B20 Human immunodeficiency virus [HIV] disease: Secondary | ICD-10-CM

## 2012-09-23 DIAGNOSIS — Z23 Encounter for immunization: Secondary | ICD-10-CM

## 2012-09-23 NOTE — Assessment & Plan Note (Signed)
He is continuing with antibiotics and will followup with the dentist. He is also going to use ibuprofen for inflammation.

## 2012-09-23 NOTE — Addendum Note (Signed)
Addended by: Andree Coss on: 09/23/2012 12:14 PM   Modules accepted: Orders

## 2012-09-23 NOTE — Assessment & Plan Note (Signed)
He is doing well with his current regimen will continue. He will return in 3 months.

## 2012-09-23 NOTE — Progress Notes (Signed)
  Subjective:    Patient ID: Frank Stark, male    DOB: 18-Oct-1980, 32 y.o.   MRN: [id number]  HPI He comes in here for routine followup. He was on Stribild continues taking that well. He denies any missed doses now and his viral load is undetectable with a good CD4 count. He feels well medically and has no other issues. No weight loss or diarrhea. He does continue to have dental problems and is scheduled to see the dentist later this month. He does have some situational depression but no suicidal ideation and is not interested in counseling at this time.   Review of Systems  Constitutional: Negative for fever, appetite change and fatigue.  HENT: Negative for sore throat and trouble swallowing.   Eyes: Negative for visual disturbance.  Respiratory: Negative for cough and shortness of breath.   Cardiovascular: Negative for chest pain and leg swelling.  Gastrointestinal: Negative for nausea, abdominal pain and diarrhea.  Musculoskeletal: Negative for myalgias and arthralgias.  Skin: Negative for rash.  Neurological: Negative for dizziness, light-headedness and headaches.  Hematological: Negative for adenopathy.  Psychiatric/Behavioral: Negative for dysphoric mood.       Objective:   Physical Exam  Constitutional: He appears well-developed and well-nourished. No distress.  HENT:  Mouth/Throat: No oropharyngeal exudate.  Eyes: Right eye exhibits no discharge. Left eye exhibits no discharge. No scleral icterus.  Cardiovascular: Normal rate, regular rhythm and normal heart sounds.   No murmur heard. Pulmonary/Chest: Breath sounds normal. No respiratory distress. He has no wheezes.  Lymphadenopathy:    He has no cervical adenopathy.  Skin: Skin is warm and dry. No rash noted.  Psychiatric: He has a normal mood and affect.          Assessment & Plan:

## 2012-10-13 ENCOUNTER — Telehealth: Payer: Self-pay | Admitting: *Deleted

## 2012-10-13 NOTE — Telephone Encounter (Signed)
Pt called, stating he accidentally took 1 truvada instead of the intended ibuprofen. Pt states he currently has diarrhea.  Pt concerned if he will have any long-lasting side effects from the 1 truvada.  Please advise. Andree Coss, RN

## 2012-10-14 NOTE — Telephone Encounter (Signed)
Should not be a problem

## 2012-10-19 ENCOUNTER — Emergency Department (HOSPITAL_COMMUNITY)
Admission: EM | Admit: 2012-10-19 | Discharge: 2012-10-19 | Disposition: A | Discharge status: Home / Self Care | Payer: Self-pay | Attending: Emergency Medicine | Admitting: Emergency Medicine

## 2012-10-19 ENCOUNTER — Encounter (HOSPITAL_COMMUNITY): Payer: Self-pay | Admitting: Family Medicine

## 2012-10-19 DIAGNOSIS — B20 Human immunodeficiency virus [HIV] disease: Secondary | ICD-10-CM | POA: Insufficient documentation

## 2012-10-19 DIAGNOSIS — Z79899 Other long term (current) drug therapy: Secondary | ICD-10-CM | POA: Insufficient documentation

## 2012-10-19 DIAGNOSIS — H53149 Visual discomfort, unspecified: Secondary | ICD-10-CM | POA: Insufficient documentation

## 2012-10-19 DIAGNOSIS — H16009 Unspecified corneal ulcer, unspecified eye: Secondary | ICD-10-CM | POA: Insufficient documentation

## 2012-10-19 DIAGNOSIS — F17200 Nicotine dependence, unspecified, uncomplicated: Secondary | ICD-10-CM | POA: Insufficient documentation

## 2012-10-19 DIAGNOSIS — H16002 Unspecified corneal ulcer, left eye: Secondary | ICD-10-CM

## 2012-10-19 DIAGNOSIS — Z792 Long term (current) use of antibiotics: Secondary | ICD-10-CM | POA: Insufficient documentation

## 2012-10-19 MED ORDER — CIPROFLOXACIN HCL 0.3 % OP OINT: TOPICAL_OINTMENT | OPHTHALMIC | Status: DC

## 2012-10-19 MED ORDER — TETRACAINE HCL 0.5 % OP SOLN
1.0000 [drp] | Freq: Once | OPHTHALMIC | Status: AC
  Administered 2012-10-19: 1 [drp] via OPHTHALMIC
  Filled 2012-10-19: qty 2

## 2012-10-19 MED ORDER — GENTAMICIN SULFATE 0.3 % OP SOLN: 1.0000 [drp] | OPHTHALMIC | Status: DC

## 2012-10-19 MED ORDER — FLUORESCEIN SODIUM 1 MG OP STRP
1.0000 | ORAL_STRIP | Freq: Once | OPHTHALMIC | Status: AC
  Administered 2012-10-19: 18:00:00 via OPHTHALMIC
  Filled 2012-10-19: qty 1

## 2012-10-19 NOTE — ED Provider Notes (Signed)
CSN: [id number]     Arrival date & time 10/19/12  1458 History  This chart was scribed for non-physician practitioner, Junious Silk PA-C,  working with Audree Camel, MD by Arlan Organ, ED Scribe. This patient was seen in room TR04C/TR04C and the patient's care was started at 1458.    Chief Complaint  Patient presents with  . Eye Problem    The history is provided by the patient. No language interpreter was used.   HPI Comments: Frank Stark is a 32 y.o. male who presents to the Emergency Department complaining of left eye pain with associated severe photophobia that started 3 days ago. Pt feels as though he has something in his eye. He currently still has his contacts in, and has been wearing them daily for the last couple of days. Pt denies pain with eye movement. 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 This feels like the time he had his ulceration. He does not have glasses that he can wear. He does not have an eye doctor at this time.   Past Medical History  Diagnosis Date  . HIV (human immunodeficiency virus infection)    History reviewed. No pertinent past surgical history. Family History  Problem Relation Age of Onset  . Hypertension Mother   . Hypertension Father   . Asthma Sister   . Diabetes Maternal Grandmother   . Dementia Maternal Grandmother   . Glaucoma Maternal Grandfather    History  Substance Use Topics  . Smoking status: Current Every Day Smoker -- 0.50 packs/day for 20 years    Types: Cigarettes  . Smokeless tobacco: Never Used     Comment: "maybe"  . Alcohol Use: Yes     Comment: daily 1-2 beer every day to twice a week     Review of Systems  A complete 10 system review of systems was obtained and all systems are negative except as noted in the HPI and PMH.   Allergies  Review of patient's allergies indicates no known allergies.  Home Medications   Current Outpatient Rx  Name  Route  Sig  Dispense  Refill  . amoxicillin (AMOXIL)  500 MG capsule   Oral   Take 500 mg by mouth 3 (three) times daily.         Marland Kitchen elvitegravir-cobicistat-emtricitabine-tenofovir (STRIBILD) 150-150-200-300 MG TABS   Oral   Take 1 tablet by mouth daily with breakfast.   30 tablet   11   . loperamide (IMODIUM) 2 MG capsule   Oral   Take 1 capsule (2 mg total) by mouth 4 (four) times daily as needed for diarrhea or loose stools.   12 capsule   0    BP 105/71  Pulse 73  Temp(Src) 97.6 F (36.4 C)  Resp 18  SpO2 99% Physical Exam  Nursing note and vitals reviewed. Constitutional: He is oriented to person, place, and time. He appears well-developed and well-nourished. No distress.  HENT:  Head: Normocephalic and atraumatic.  Right Ear: External ear normal.  Left Ear: External ear normal.  Nose: Nose normal.  Eyes: Conjunctivae, EOM and lids are normal. Pupils are equal, round, and reactive to light. Lids are everted and swept, no foreign bodies found.  Slit lamp exam:      The left eye shows fluorescein uptake. The left eye shows no foreign body, no hyphema and no hypopyon.  Diffuse uptake of fluorescein to eye Photophobia Pain with consensual light reflex No seidel sign, no dendritic lesions  Neck: Normal range of motion. No tracheal deviation present.  Cardiovascular: Normal rate, regular rhythm and normal heart sounds.   Pulmonary/Chest: Effort normal and breath sounds normal. No stridor.  Abdominal: Soft. He exhibits no distension. There is no tenderness.  Musculoskeletal: Normal range of motion.  Neurological: He is alert and oriented to person, place, and time.  Skin: Skin is warm and dry. He is not diaphoretic.  Psychiatric: He has a normal mood and affect. His behavior is normal.    ED Course  Procedures (including critical care time)  DIAGNOSTIC STUDIES: Oxygen Saturation is 99% on RA, Normal by my interpretation.    COORDINATION OF CARE: 4:33 PM-Discussed treatment plan which includes checking for corneal  abrasions and foreign bodies. Pt agreed to plan.   4:57 PM Discussed case with Dr. Delaney Meigs from opthalmology. He recommends clindamycin, gentamycin, culture of the contact lens and follow up in his office tomorrow at 3pm. Will relay this to the patient.    Labs Review Labs Reviewed  WOUND CULTURE   Imaging Review No results found.  MDM   1. Corneal ulceration, left    Pt with concern for corneal ulceration. Eye irrigated w NS, no evidence of FB.  Visual acuity 20/200, but pt is not wearing his contact.  Pt is a contact lens wearer. Severe photophobia. Exam non-concerning for orbital cellulitis, hyphema. Patient will be discharged home with clindamycin and gentamycin drops. I discussed this case with opthomology and they will see him in the office tomorrow. Contact lens will be sent for culture. Dr. Criss Alvine evaluated patient and agrees with plan. Return instructions given. Vital signs stable for discharge.    I personally performed the services described in this documentation, which was scribed in my presence. The recorded information has been reviewed and is accurate.     Mora Bellman, PA-C 10/19/12 1931

## 2012-10-19 NOTE — ED Notes (Signed)
Per pt sts since yesterday he has had pain and sensitivity to light in the left eye. sts hx of corneal ulcer. Pt left eye very red and sts vision blurry.

## 2012-10-20 NOTE — ED Provider Notes (Signed)
Medical screening examination/treatment/procedure(s) were conducted as a shared visit with non-physician practitioner(s) and myself.  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 I told him that he needs to not wear contacts in the left eye until cleared by an ophthamologist, and he verbalized understanding. Will contact ophtho for close f/u as patient does not have an eye doctor.   Audree Camel, MD 10/20/12 [phone]

## 2012-10-20 NOTE — Progress Notes (Signed)
ED CM received call from [facility] Pharmacist at [facility] [phone] in regards to changing the  ciprofloxacin (CILOXAN) ophthalmic ointment 0.3% to dispensing the ophthalmic solution instead of ointment. Spoke with Dr. Elesa Massed, she authorized  the change.

## 2012-10-21 LAB — WOUND CULTURE
Culture: NO GROWTH
Gram Stain: NONE SEEN

## 2012-12-11 ENCOUNTER — Other Ambulatory Visit: Payer: Self-pay

## 2012-12-17 ENCOUNTER — Other Ambulatory Visit (INDEPENDENT_AMBULATORY_CARE_PROVIDER_SITE_OTHER): Payer: Self-pay

## 2012-12-17 ENCOUNTER — Telehealth: Payer: Self-pay | Admitting: *Deleted

## 2012-12-17 ENCOUNTER — Ambulatory Visit: Payer: Self-pay | Admitting: *Deleted

## 2012-12-17 DIAGNOSIS — F3289 Other specified depressive episodes: Secondary | ICD-10-CM

## 2012-12-17 DIAGNOSIS — F329 Major depressive disorder, single episode, unspecified: Secondary | ICD-10-CM

## 2012-12-17 DIAGNOSIS — B20 Human immunodeficiency virus [HIV] disease: Secondary | ICD-10-CM

## 2012-12-17 NOTE — Progress Notes (Signed)
Patient ID: Frank Stark, male   DOB: February 27, 1980, 32 y.o.   MRN: [id number] Met with patient at request of staff due to a self-report of depression, also tearful today on phone with [facility] nurse. [name] shared that he has been having conflict with his partner, Will, and that Will is negative toward him due to his present unemployment. [name] stated that he has difficulty communicating with Will and "keeps a lot in". He feels this has caused his depression and increased the tension level at home with partner. He agrees that counseling would be beneficial to him and agrees to see Roderic Scarce for therapy, and this Clinical research associate on an interim basis until Bernette Redbird has an available appt time. Writer and patient addressed his family support with mother & father and [name]'s need for any immediate crisis services. He states that he is not in crisis and is willing to commit to counseling to further evaluate his options and to work on improved coping. Client denies active SI and confirms a desire to resolve problems. Declines consideration for anti-depressant medication eval, but is open to reconsidering this should his depressive feelings worsen over next few weeks. Patient was encouraged to make contact with the [facility] clinic as needed. He agrees to do so and makes appt to return to see this Clinical research associate for interim support next week, 11/19.   Reem Fleury, CS[facility]

## 2012-12-17 NOTE — Telephone Encounter (Signed)
Patient called stating he missed an appointment, needed to reschedule. Pt given lab appointment for today, has f/u scheduled for 11/20 with Dr. Luciana Axe.  RN asked if there was anything going on that caused the missed appt. Pt started crying, stating that he felt like he was in a really bad place right now, wanted to speak to a counselor.  RN asked if she could help, pt stated he'd rather just speak face-to-face with someone.  RN gave him an appointment at 11:00 with Max Menius, counselor.  Pt verbalized agreement, stated he would be on his way. Andree Coss, RN

## 2012-12-18 LAB — T-HELPER CELL (CD4) - (RCID CLINIC ONLY): CD4 % Helper T Cell: 40 % (ref 33–55)

## 2012-12-23 ENCOUNTER — Ambulatory Visit: Payer: Self-pay | Admitting: *Deleted

## 2012-12-23 DIAGNOSIS — F329 Major depressive disorder, single episode, unspecified: Secondary | ICD-10-CM

## 2012-12-23 DIAGNOSIS — F3289 Other specified depressive episodes: Secondary | ICD-10-CM

## 2012-12-23 NOTE — Progress Notes (Signed)
Patient ID: Frank Stark, male   DOB: [birth year], 32 y.o.   MRN: [id number] Frank Stark arrived at clinic for another service but asked if he could see this writer today instead of for the scheduled appt tomorrow. This Clinical research associate did meet with Frank Stark. Frank Stark shared that he has come to the conclusion that he and Frank Stark cannot remain together as a couple. Consequently, pt is evaluating other residential options and has committed himself to transitioning out of the apartment with Frank Stark. Counselor provided support and processed with patient his present supportive relationships with others and how he might manage his "stress" and depressive feelings through increased contact with positive people in his life and a continued effort to share his feelings with concerned others. Frank Stark was responsive to counselor support and used the session to articulate some of his ideas and plans for moving forward as well as examining his own ineffective past responses to pressure and how he might shore up his emotional reserve and personal coping. Pt shared several job opportunities he has pursued since last meeting. He also disclosed that his mother and father have been a good general support and that he can access them as needed for help. While acknowleding some depressive feelings, pt affect was appropriately variable with some smiling and a definite solution focused orientation. Kyon expressed appreciation for [facility] counseling support and said that he felt better following last session. Counselor validated pt investment in receiving supportive counseling and in reorienting himself toward physical and emotional wellness. He Frank Stark schedule again as needed with this Clinical research associate until therapist Franne Forts is available.   Frank Stark, CS[facility]

## 2012-12-24 ENCOUNTER — Ambulatory Visit: Payer: Self-pay | Admitting: *Deleted

## 2012-12-25 ENCOUNTER — Ambulatory Visit (INDEPENDENT_AMBULATORY_CARE_PROVIDER_SITE_OTHER): Payer: Self-pay | Admitting: Internal Medicine

## 2012-12-25 ENCOUNTER — Encounter: Payer: Self-pay | Admitting: Internal Medicine

## 2012-12-25 VITALS — BP 119/75 | HR 71 | Temp 98.1°F | Ht 70.0 in | Wt 132.0 lb

## 2012-12-25 DIAGNOSIS — R85610 Atypical squamous cells of undetermined significance on cytologic smear of anus (ASC-US): Secondary | ICD-10-CM

## 2012-12-25 DIAGNOSIS — Z23 Encounter for immunization: Secondary | ICD-10-CM

## 2012-12-25 DIAGNOSIS — K4090 Unilateral inguinal hernia, without obstruction or gangrene, not specified as recurrent: Secondary | ICD-10-CM

## 2012-12-25 DIAGNOSIS — B20 Human immunodeficiency virus [HIV] disease: Secondary | ICD-10-CM

## 2012-12-25 NOTE — Progress Notes (Signed)
  Subjective:    Patient ID: Frank Stark, male    DOB: 1980-04-13, 32 y.o.   MRN: [id number]  HPI He comes in for a followup visit. He has a history of HIV and I started him previously on Stribild. However due to some depression in home issues, he stopped it until recently. His last viral load was 500 for copies. He restart his medication 3 days ago. He also has complaint of a left inguinal hernia, some pain on his feet and anal warts.  No fever, no chills and no weight loss.   Review of Systems  Constitutional: Negative for fever, appetite change and fatigue.  Eyes: Negative for visual disturbance.  Respiratory: Negative for cough and shortness of breath.   Cardiovascular: Negative for chest pain.  Gastrointestinal: Negative for nausea, diarrhea and constipation.  Genitourinary:       Inguinal mass   Musculoskeletal: Negative for back pain.  Skin: Negative for rash.  Neurological: Negative for dizziness, light-headedness and headaches.  Hematological: Negative for adenopathy.  Psychiatric/Behavioral: Positive for dysphoric mood.       Objective:   Physical Exam  Constitutional: He is oriented to person, place, and time. He appears well-developed and well-nourished. No distress.  HENT:  Mouth/Throat: No oropharyngeal exudate.  Eyes: Right eye exhibits no discharge. Left eye exhibits no discharge. No scleral icterus.  Cardiovascular: Normal rate, regular rhythm and normal heart sounds.   No murmur heard. Pulmonary/Chest: Effort normal and breath sounds normal. No respiratory distress. He has no wheezes.  Genitourinary:  No anal warts noted  Lymphadenopathy:    He has no cervical adenopathy.  Neurological: He is alert and oriented to person, place, and time.  Skin:  Toes with dry scaly skin  Psychiatric: He has a normal mood and affect. His behavior is normal.          Assessment & Plan:

## 2012-12-25 NOTE — Assessment & Plan Note (Signed)
We'll have him send for repeat anal Pap smear

## 2012-12-25 NOTE — Assessment & Plan Note (Signed)
He will be referred to surgery at [facility]

## 2012-12-25 NOTE — Assessment & Plan Note (Signed)
He has restarted his regimen and I will check his virus in about 2 weeks with a genotype. He was advised not to stop and start to be sure to take his medication daily. I will see him one week after his lab appointment.

## 2013-01-07 ENCOUNTER — Other Ambulatory Visit (INDEPENDENT_AMBULATORY_CARE_PROVIDER_SITE_OTHER): Payer: Self-pay

## 2013-01-07 DIAGNOSIS — B20 Human immunodeficiency virus [HIV] disease: Secondary | ICD-10-CM

## 2013-01-08 LAB — HIV-1 RNA ULTRAQUANT REFLEX TO GENTYP+: HIV-1 RNA Quant, Log: <1.3 {Log} (ref <1.30)

## 2013-01-15 ENCOUNTER — Ambulatory Visit: Payer: Self-pay | Admitting: Internal Medicine

## 2013-01-22 ENCOUNTER — Ambulatory Visit: Payer: Self-pay | Admitting: Internal Medicine

## 2013-02-10 ENCOUNTER — Ambulatory Visit: Payer: Self-pay

## 2013-02-13 ENCOUNTER — Ambulatory Visit (INDEPENDENT_AMBULATORY_CARE_PROVIDER_SITE_OTHER): Payer: Self-pay | Admitting: Infectious Diseases

## 2013-02-13 ENCOUNTER — Encounter: Payer: Self-pay | Admitting: Infectious Diseases

## 2013-02-13 VITALS — BP 116/71 | HR 69 | Temp 98.1°F | Ht 70.0 in | Wt 137.0 lb

## 2013-02-13 DIAGNOSIS — R85610 Atypical squamous cells of undetermined significance on cytologic smear of anus (ASC-US): Secondary | ICD-10-CM

## 2013-02-13 NOTE — Progress Notes (Signed)
Patient ID: Frank AmmonsMichael J Stark, male   DOB: 1980-06-02, 33 y.o.   MRN: [id number] 33 yo M with hx of HIV+, previously on stribild. He also has a hx of ASCUS cells on anal PAP. He is here today for anoscopy.  Has noticed anal warts since 2013. [facility] and had external exam. Had no further f/u.  Had recent URI- rhinorrhea, sore throat, sinus pain. Now back to normal except for voice change.  Procedure explained, consent obtained  HIV 1 RNA Quant (copies/mL)  Date Value  01/07/2013 <20   12/17/2012 504*  09/08/2012 <20      CD4 T Cell Abs (/uL)  Date Value  12/17/2012 610   09/08/2012 700   06/04/2012 450     PRE-OPERATIVE DIAGNOSIS:  Anal condyloma, HRA with bx  POST-OPERATIVE DIAGNOSIS:  Anal condyloma, HRA with bx  PROCEDURE:    SURGEON:  Valdez Brannan  ASSISTANT: Cockerham  ANESTHESIA:   local  EBL: < 10 cc   SPECIMEN:  No Specimen  DISPOSITION OF SPECIMEN:  N/A  COUNTS:  YES  PLAN OF CARE: home  PATIENT DISPOSITION:  stable, home  INDICATION: previous abnormal anal PAP, anal warts, HPV  OR FINDINGS: hemmerhoids.   DESCRIPTION: The patient was identified in the waiting area and taken to the exam room where they were laid on the table in the lateral decubitus position.  The patient was then prepped and draped in the usual fashion. A surgical timeout was performed indicating the correct patient, procedure, positioning.   After this was completed, a sponge was soaked in 2% acetic acid was placed over the perianal region. This was allowed to soak for 1 minute. The sponge was removed and the perianal region was evaluated with a anooscope.  There was a skin tag at the 12 o'clock position.  The internal anal canal was evaluated via anoscopy with an anoscope.  There was a small 2mm acetowhite area at the 6 o'clock position internally. There was a large hemmeroid at 6 o'clock as well.  No biopsies were taken, there was no bleeding.   Plan:  I offered to refill his hemmeroid  cream.  Will consider repeat anoscopy 1 year to f/u.

## 2013-02-26 ENCOUNTER — Ambulatory Visit: Payer: Self-pay | Admitting: Internal Medicine

## 2013-02-26 ENCOUNTER — Ambulatory Visit: Payer: Self-pay

## 2013-03-10 ENCOUNTER — Other Ambulatory Visit: Payer: Self-pay | Admitting: *Deleted

## 2013-03-10 ENCOUNTER — Ambulatory Visit (INDEPENDENT_AMBULATORY_CARE_PROVIDER_SITE_OTHER): Payer: Self-pay | Admitting: Internal Medicine

## 2013-03-10 ENCOUNTER — Ambulatory Visit: Payer: Self-pay

## 2013-03-10 ENCOUNTER — Encounter: Payer: Self-pay | Admitting: Internal Medicine

## 2013-03-10 ENCOUNTER — Ambulatory Visit: Payer: Self-pay | Admitting: Internal Medicine

## 2013-03-10 VITALS — BP 114/67 | HR 80 | Temp 98.2°F | Ht 70.0 in | Wt 138.0 lb

## 2013-03-10 DIAGNOSIS — Z21 Asymptomatic human immunodeficiency virus [HIV] infection status: Secondary | ICD-10-CM

## 2013-03-10 DIAGNOSIS — B20 Human immunodeficiency virus [HIV] disease: Secondary | ICD-10-CM

## 2013-03-10 DIAGNOSIS — K4090 Unilateral inguinal hernia, without obstruction or gangrene, not specified as recurrent: Secondary | ICD-10-CM

## 2013-03-10 DIAGNOSIS — I319 Disease of pericardium, unspecified: Secondary | ICD-10-CM

## 2013-03-10 MED ORDER — DOCUSATE SODIUM 100 MG PO CAPS: 100.0000 mg | ORAL_CAPSULE | Freq: Every day | ORAL | Status: DC

## 2013-03-10 MED ORDER — ELVITEG-COBIC-EMTRICIT-TENOFDF 150-150-200-300 MG PO TABS: 1.0000 | ORAL_TABLET | Freq: Every day | ORAL | Status: DC

## 2013-03-10 NOTE — Assessment & Plan Note (Signed)
She has noted increased bulging and his left hernia. [place] and [facility]

## 2013-03-10 NOTE — Assessment & Plan Note (Signed)
She is doing well on his regimen now that he has restarted. His viral load went back to undetectable in December. He will return in 2 months for labs and I will see him one to 2 weeks after that

## 2013-03-10 NOTE — Progress Notes (Signed)
  Subjective:    Patient ID: Frank Stark, male    DOB: 1980-02-18, 33 y.o.   MRN: [id number]  HPI  He comes in for a followup visit. He has a history of HIV and I started him previously on Stribild. In November, he had stopped it for a short time partly due to depression.  His viral load was 500 at that time after being undetectable. He then restarted his medications. He also has complaint of a left inguinal hernia, some pain on his feet and anal warts.  No fever, no chills and no weight loss.  He did have his anal Pap smear. He did not yet have an orange card to get to surgery.  [place] was seen in the emergency department in [place] in the record was reviewed and was musculoskeletal chest pain.   Review of Systems  Constitutional: Negative for fever, appetite change and fatigue.  Eyes: Negative for visual disturbance.  Respiratory: Negative for cough and shortness of breath.   Cardiovascular: Negative for chest pain.  Gastrointestinal: Negative for nausea, diarrhea and constipation.  Genitourinary:       Inguinal mass   Musculoskeletal: Negative for back pain.  Skin: Negative for rash.  Neurological: Negative for dizziness, light-headedness and headaches.  Hematological: Negative for adenopathy.  Psychiatric/Behavioral: Positive for dysphoric mood.       Objective:   Physical Exam  Constitutional: He is oriented to person, place, and time. He appears well-developed and well-nourished. No distress.  HENT:  Mouth/Throat: No oropharyngeal exudate.  Eyes: Right eye exhibits no discharge. Left eye exhibits no discharge. No scleral icterus.  Cardiovascular: Normal rate, regular rhythm and normal heart sounds.   No murmur heard. Pulmonary/Chest: Effort normal and breath sounds normal. No respiratory distress. He has no wheezes.  Genitourinary:  No anal warts noted  Lymphadenopathy:    He has no cervical adenopathy.  Neurological: He is alert and oriented to person, place, and time.   Psychiatric: He has a normal mood and affect. His behavior is normal.          Assessment & Plan:

## 2013-03-10 NOTE — Assessment & Plan Note (Signed)
Some non cardiac chest pain in Frank Stark, now resolved.

## 2013-03-11 ENCOUNTER — Ambulatory Visit: Payer: Self-pay | Admitting: Internal Medicine

## 2013-03-26 ENCOUNTER — Telehealth: Payer: Self-pay | Admitting: *Deleted

## 2013-03-26 NOTE — Telephone Encounter (Signed)
[place] from [place], asking if we were connected to any clinics there.  Pt reports 2 days of N/V/D, unable to keep his medication down.  Pt is still urinating.  RN advised patient to seek care locally (ED, Urgent Care, otherwise) and to contact us back to let us know how he is feeling.  Pt states he has not missed his medications other than these past 2 days.  RN reinforced that he should seek care for his symptoms so that he will be able to get back on his medications.  Pt verbalized understanding, agreement.  Updated his contact information, confirmed upcoming appointments. Andree CossHowell, Maryalice Pasley M, RN

## 2013-04-10 ENCOUNTER — Other Ambulatory Visit: Payer: Self-pay | Admitting: Internal Medicine

## 2013-04-10 ENCOUNTER — Other Ambulatory Visit: Payer: Self-pay | Admitting: Licensed Clinical Social Worker

## 2013-04-10 DIAGNOSIS — B20 Human immunodeficiency virus [HIV] disease: Secondary | ICD-10-CM

## 2013-04-10 MED ORDER — ELVITEG-COBIC-EMTRICIT-TENOFDF 150-150-200-300 MG PO TABS
1.0000 | ORAL_TABLET | Freq: Every day | ORAL | Status: DC
Start: 2013-04-10 — End: 2013-05-19

## 2013-04-29 ENCOUNTER — Ambulatory Visit: Payer: Self-pay | Admitting: *Deleted

## 2013-05-05 ENCOUNTER — Ambulatory Visit: Payer: Self-pay | Admitting: *Deleted

## 2013-05-05 ENCOUNTER — Other Ambulatory Visit: Payer: Self-pay

## 2013-05-05 ENCOUNTER — Other Ambulatory Visit: Payer: Self-pay | Admitting: Internal Medicine

## 2013-05-12 ENCOUNTER — Other Ambulatory Visit (INDEPENDENT_AMBULATORY_CARE_PROVIDER_SITE_OTHER): Payer: Self-pay

## 2013-05-12 DIAGNOSIS — B20 Human immunodeficiency virus [HIV] disease: Secondary | ICD-10-CM

## 2013-05-12 LAB — CBC WITH DIFFERENTIAL/PLATELET
BASOS PCT: 1 % (ref 0–1)
Basophils Absolute: 0 10*3/uL (ref 0.0–0.1)
EOS PCT: 2 % (ref 0–5)
Eosinophils Absolute: 0.1 10*3/uL (ref 0.0–0.7)
HEMATOCRIT: 39.5 % (ref 39.0–52.0)
Hemoglobin: 13.7 g/dL (ref 13.0–17.0)
LYMPHS PCT: 48 % — AB (ref 12–46)
Lymphs Abs: 1.8 10*3/uL (ref 0.7–4.0)
MCH: 31.7 pg (ref 26.0–34.0)
MCHC: 34.7 g/dL (ref 30.0–36.0)
MCV: 91.4 fL (ref 78.0–100.0)
MONO ABS: 0.2 10*3/uL (ref 0.1–1.0)
Monocytes Relative: 6 % (ref 3–12)
Neutro Abs: 1.6 10*3/uL — ABNORMAL LOW (ref 1.7–7.7)
Neutrophils Relative %: 43 % (ref 43–77)
Platelets: 152 10*3/uL (ref 150–400)
RBC: 4.32 MIL/uL (ref 4.22–5.81)
RDW: 13.7 % (ref 11.5–15.5)
WBC: 3.8 10*3/uL — AB (ref 4.0–10.5)

## 2013-05-13 LAB — COMPLETE METABOLIC PANEL WITH GFR
ALK PHOS: 70 U/L (ref 39–117)
ALT: 11 U/L (ref 0–53)
AST: 18 U/L (ref 0–37)
Albumin: 4.3 g/dL (ref 3.5–5.2)
BILIRUBIN TOTAL: 0.3 mg/dL (ref 0.2–1.2)
BUN: 21 mg/dL (ref 6–23)
CALCIUM: 9.4 mg/dL (ref 8.4–10.5)
CHLORIDE: 100 meq/L (ref 96–112)
CO2: 30 mEq/L (ref 19–32)
CREATININE: 1.17 mg/dL (ref 0.50–1.35)
GFR, Est African American: >89 mL/min
GFR, Est Non African American: 80 mL/min
Glucose, Bld: 86 mg/dL (ref 70–99)
Potassium: 4 mEq/L (ref 3.5–5.3)
Sodium: 138 mEq/L (ref 135–145)
Total Protein: 7.6 g/dL (ref 6.0–8.3)

## 2013-05-13 LAB — T-HELPER CELL (CD4) - (RCID CLINIC ONLY)
CD4 % Helper T Cell: 40 % (ref 33–55)
CD4 T CELL ABS: 730 /uL (ref 400–2700)

## 2013-05-14 LAB — HIV-1 RNA QUANT-NO REFLEX-BLD: HIV 1 RNA Quant: <20 copies/mL (ref <20)

## 2013-05-19 ENCOUNTER — Ambulatory Visit (INDEPENDENT_AMBULATORY_CARE_PROVIDER_SITE_OTHER): Payer: Self-pay | Admitting: Internal Medicine

## 2013-05-19 ENCOUNTER — Encounter: Payer: Self-pay | Admitting: Internal Medicine

## 2013-05-19 VITALS — BP 93/56 | HR 59 | Temp 98.3°F | Ht 70.0 in | Wt 134.0 lb

## 2013-05-19 DIAGNOSIS — Z21 Asymptomatic human immunodeficiency virus [HIV] infection status: Secondary | ICD-10-CM

## 2013-05-19 DIAGNOSIS — B20 Human immunodeficiency virus [HIV] disease: Secondary | ICD-10-CM

## 2013-05-19 MED ORDER — TRAZODONE HCL 50 MG PO TABS: 50.0000 mg | ORAL_TABLET | Freq: Every day | ORAL | Status: DC

## 2013-05-19 MED ORDER — ABACAVIR-DOLUTEGRAVIR-LAMIVUD 600-50-300 MG PO TABS: 1.0000 | ORAL_TABLET | Freq: Every day | ORAL | Status: DC

## 2013-05-19 NOTE — Progress Notes (Signed)
Patient ID: Frank Stark, male   DOB: 04/23/1980, 33 y.o.   MRN: [id number] HPI: Frank Stark is a 33 y.o. male who is here for his f/u visit.   Allergies: No Known Allergies  Vitals: Temp: 98.3 F (36.8 C) (04/14 1533) Temp src: Oral (04/14 1533) BP: 93/56 mmHg (04/14 1533) Pulse Rate: 59 (04/14 1533)  Past Medical History: Past Medical History  Diagnosis Date  . HIV (human immunodeficiency virus infection)     Social History: History   Social History  . Marital Status: Single    Spouse Name: N/A    Number of Children: N/A  . Years of Education: N/A   Social History Main Topics  . Smoking status: Current Every Day Smoker -- 1.00 packs/day for 25 years    Types: Cigarettes  . Smokeless tobacco: Never Used     Comment: electronic cig sometimes  . Alcohol Use: Yes     Comment: daily 1-2 beer every day to twice a week   . Drug Use: Yes    Special: Marijuana     Comment: occasional  . Sexual Activity: Yes    Partners: Male     Comment: pt given condoms   Other Topics Concern  . None   Social History Narrative  . None    Previous Regimen:   Current Regimen: Stribild  Labs: HIV 1 RNA Quant (copies/mL)  Date Value  05/12/2013 <20   01/07/2013 <20   12/17/2012 504*     CD4 T Cell Abs (/uL)  Date Value  05/12/2013 730   12/17/2012 610   09/08/2012 700      Hep B S Ab (no units)  Date Value  02/26/2012 NONREACTIVE      Hepatitis B Surface Ag (no units)  Date Value  02/26/2012 NEGATIVE      HCV Ab (no units)  Date Value  02/26/2012 NEGATIVE     CrCl: Estimated Creatinine Clearance: 75.8 ml/min (by C-G formula based on Cr of 1.17).  Lipids:    Component Value Date/Time   CHOL 121 02/26/2012 1134   TRIG 72 02/26/2012 1134   HDL 36* 02/26/2012 1134   CHOLHDL 3.4 02/26/2012 1134   VLDL 14 02/26/2012 1134   LDLCALC 71 02/26/2012 1134    Assessment: 33 yo is well controlled on Stribild. He did say that he sometimes misses some days do to side  effects. Mostly GI. He could take liquid but ritonavir is on back ordered right now anyway. Talked to Dr. Linus Salmons about crushing Triumeq instead. Didn't see resistance as an issue on his profile. Going to check HLA -[id number] and start Triumeq  Recommendations:  Stop Stribild [id number] If neg then start Triumeq 1 PO qday Can crush if needed  Wilfred Lacy, PharmD Clinical Infectious Disease [facility] 05/19/2013, 4:15 PM

## 2013-05-19 NOTE — Assessment & Plan Note (Signed)
I discussed options with him and we have opted to change to Triumeq and he can crush and take with juice.  He will start now and rtc in 1 month to see how he is doing.

## 2013-05-19 NOTE — Progress Notes (Signed)
   Subjective:    Patient ID: Frank AmmonsMichael J Crossett, male    DOB: Oct 06, 1980, 33 y.o.   MRN: [id number]  HPI Here for follow up of his HIV.  On Stribild but unfortunately reports about 2 missed doses weekly.  He has a poor appetite and when he tries to take it without food, he is unable to swallow it.  His labs are good with a suppressed viral load but he is interested in changing.  No weight loss, no diarrhea.  Otherwise doing ok.     Review of Systems  Constitutional: Negative for fatigue.  Respiratory: Negative for cough and shortness of breath.   Gastrointestinal: Negative for nausea and diarrhea.  Skin: Negative for rash.  Neurological: Negative for dizziness and light-headedness.  Psychiatric/Behavioral: Positive for dysphoric mood.       Objective:   Physical Exam  Constitutional: He appears well-developed and well-nourished. No distress.  HENT:  Mouth/Throat: No oropharyngeal exudate.  Eyes: Right eye exhibits no discharge. Left eye exhibits no discharge. No scleral icterus.  Cardiovascular: Normal rate, regular rhythm and normal heart sounds.   No murmur heard. Pulmonary/Chest: Effort normal and breath sounds normal. No respiratory distress.  Lymphadenopathy:    He has no cervical adenopathy.  Skin: No rash noted.          Assessment & Plan:

## 2013-06-18 ENCOUNTER — Other Ambulatory Visit: Payer: Self-pay

## 2013-06-26 ENCOUNTER — Other Ambulatory Visit: Payer: Self-pay

## 2013-07-07 ENCOUNTER — Other Ambulatory Visit (INDEPENDENT_AMBULATORY_CARE_PROVIDER_SITE_OTHER): Payer: Self-pay

## 2013-07-07 ENCOUNTER — Ambulatory Visit: Payer: Self-pay | Admitting: Internal Medicine

## 2013-07-07 DIAGNOSIS — B20 Human immunodeficiency virus [HIV] disease: Secondary | ICD-10-CM

## 2013-07-08 LAB — T-HELPER CELL (CD4) - (RCID CLINIC ONLY)
CD4 T CELL ABS: 820 /uL (ref 400–2700)
CD4 T CELL HELPER: 41 % (ref 33–55)

## 2013-07-09 LAB — HIV-1 RNA QUANT-NO REFLEX-BLD

## 2013-07-13 LAB — HLA B*5701: HLA-B 5701 W/RFLX HLA-B HIGH: NEGATIVE

## 2013-07-15 ENCOUNTER — Telehealth: Payer: Self-pay | Admitting: *Deleted

## 2013-07-15 NOTE — Telephone Encounter (Signed)
Patient called and advised his partner is in the hospital with Gonorrhea and he wants to be tested for it. He reports no symptoms at all. He has no discharge, pain or burning with urination. Advised him he has an appt 07/21/13 and if the doctor feels he needs to be tested he will do it at the visit. The patient was ok with this, reminded him of appt date and time.

## 2013-07-21 ENCOUNTER — Encounter: Payer: Self-pay | Admitting: Internal Medicine

## 2013-07-21 ENCOUNTER — Ambulatory Visit (INDEPENDENT_AMBULATORY_CARE_PROVIDER_SITE_OTHER): Payer: Self-pay | Admitting: Internal Medicine

## 2013-07-21 VITALS — BP 118/77 | HR 85 | Temp 98.4°F | Ht 70.0 in | Wt 135.0 lb

## 2013-07-21 DIAGNOSIS — B20 Human immunodeficiency virus [HIV] disease: Secondary | ICD-10-CM

## 2013-07-21 DIAGNOSIS — Z202 Contact with and (suspected) exposure to infections with a predominantly sexual mode of transmission: Secondary | ICD-10-CM

## 2013-07-21 DIAGNOSIS — Z21 Asymptomatic human immunodeficiency virus [HIV] infection status: Secondary | ICD-10-CM

## 2013-07-21 DIAGNOSIS — R32 Unspecified urinary incontinence: Secondary | ICD-10-CM | POA: Insufficient documentation

## 2013-07-21 LAB — BASIC METABOLIC PANEL
BUN: 17 mg/dL (ref 6–23)
CO2: 29 mEq/L (ref 19–32)
Calcium: 9.7 mg/dL (ref 8.4–10.5)
Chloride: 105 mEq/L (ref 96–112)
Creat: 0.99 mg/dL (ref 0.50–1.35)
Glucose, Bld: 82 mg/dL (ref 70–99)
POTASSIUM: 3.9 meq/L (ref 3.5–5.3)
Sodium: 141 mEq/L (ref 135–145)

## 2013-07-21 NOTE — Assessment & Plan Note (Signed)
Doing well on regimen.  RTC 3 months.

## 2013-07-21 NOTE — Progress Notes (Signed)
   Subjective:    Patient ID: Frank AmmonsMichael J Stark, male    DOB: 13-Aug-1980, 33 y.o.   MRN: [id number]  HPI  Here for follow up of his HIV.  On Triumeq after switching from Stribild since he can't swallow whole.  Takes daily with no missed doses.  Has to crush it.  His labs are good with a suppressed viral load.  No weight loss, no diarrhea.   Recent possible exposure to [facility], receptive anal intercourse. No symptoms of ulcer, rash, penile discharge.     Has some early am enuresis.  Never happened before.     Review of Systems  Constitutional: Negative for fatigue.  Respiratory: Negative for cough and shortness of breath.   Gastrointestinal: Negative for nausea and diarrhea.  Skin: Negative for rash.  Neurological: Negative for dizziness and light-headedness.  Psychiatric/Behavioral: Negative for dysphoric mood.       Objective:   Physical Exam  Constitutional: He appears well-developed and well-nourished. No distress.  HENT:  Mouth/Throat: No oropharyngeal exudate.  Eyes: Right eye exhibits no discharge. Left eye exhibits no discharge. No scleral icterus.  Cardiovascular: Normal rate, regular rhythm and normal heart sounds.   No murmur heard. Pulmonary/Chest: Effort normal and breath sounds normal. No respiratory distress.  Lymphadenopathy:    He has no cervical adenopathy.  Skin: No rash noted.          Assessment & Plan:

## 2013-07-21 NOTE — Assessment & Plan Note (Addendum)
Rectal swab performed and will check urine as well.   Also, RPR.

## 2013-07-21 NOTE — Assessment & Plan Note (Signed)
Unknown etiology.  Will check UA, electrolytes.

## 2013-07-22 LAB — URINALYSIS, ROUTINE W REFLEX MICROSCOPIC
Glucose, UA: NEGATIVE mg/dL
HGB URINE DIPSTICK: NEGATIVE
KETONES UR: NEGATIVE mg/dL
Nitrite: NEGATIVE
PH: 6.5 (ref 5.0–8.0)
Protein, ur: NEGATIVE mg/dL
Specific Gravity, Urine: 1.027 (ref 1.005–1.030)
UROBILINOGEN UA: 1 mg/dL (ref 0.0–1.0)

## 2013-07-22 LAB — URINALYSIS, MICROSCOPIC ONLY
BACTERIA UA: NONE SEEN
CASTS: NONE SEEN
Crystals: NONE SEEN

## 2013-07-22 LAB — RPR

## 2013-08-31 ENCOUNTER — Encounter (HOSPITAL_COMMUNITY): Payer: Self-pay | Admitting: Emergency Medicine

## 2013-08-31 ENCOUNTER — Emergency Department (HOSPITAL_COMMUNITY)
Admission: EM | Admit: 2013-08-31 | Discharge: 2013-08-31 | Disposition: A | Discharge status: Home / Self Care | Payer: Self-pay | Attending: Emergency Medicine | Admitting: Emergency Medicine

## 2013-08-31 DIAGNOSIS — F17200 Nicotine dependence, unspecified, uncomplicated: Secondary | ICD-10-CM | POA: Insufficient documentation

## 2013-08-31 DIAGNOSIS — J029 Acute pharyngitis, unspecified: Secondary | ICD-10-CM

## 2013-08-31 DIAGNOSIS — Z79899 Other long term (current) drug therapy: Secondary | ICD-10-CM | POA: Insufficient documentation

## 2013-08-31 DIAGNOSIS — Z21 Asymptomatic human immunodeficiency virus [HIV] infection status: Secondary | ICD-10-CM | POA: Insufficient documentation

## 2013-08-31 LAB — RAPID STREP SCREEN (MED CTR MEBANE ONLY): STREPTOCOCCUS, GROUP A SCREEN (DIRECT): NEGATIVE

## 2013-08-31 MED ORDER — TRAMADOL HCL 50 MG PO TABS: 50.0000 mg | ORAL_TABLET | Freq: Four times a day (QID) | ORAL | Status: DC | PRN

## 2013-08-31 MED ORDER — LIDOCAINE VISCOUS 2 % MT SOLN: 15.0000 mL | Freq: Once | OROMUCOSAL | Status: DC

## 2013-08-31 NOTE — ED Notes (Addendum)
Presents with sore throat began yesterday AM pain is worse with swallowing. Handling secretions, afebrile. Throat red, no exudate noted.

## 2013-08-31 NOTE — ED Notes (Signed)
Reports sore throat since waking yesterday morning.  Unable to eat due to pain, is drinking.  Throat reddened.  Pt reports pain >L than R.

## 2013-08-31 NOTE — ED Provider Notes (Signed)
CSN: [id number]     Arrival date & time 08/31/13  0010 History   First MD Initiated Contact with Patient 08/31/13 0040     Chief Complaint  Patient presents with  . Sore Throat     (Consider location/radiation/quality/duration/timing/severity/associated sxs/prior Treatment) Patient is a 33 y.o. male presenting with pharyngitis. The history is provided by the patient.  Sore Throat This is a new problem. The current episode started 12 to 24 hours ago. The problem occurs constantly. The problem has not changed since onset.Pertinent negatives include no chest pain, no abdominal pain, no headaches and no shortness of breath. Nothing aggravates the symptoms. Nothing relieves the symptoms. He has tried nothing for the symptoms. The treatment provided no relief.    Past Medical History  Diagnosis Date  . HIV (human immunodeficiency virus infection)    History reviewed. No pertinent past surgical history. Family History  Problem Relation Age of Onset  . Hypertension Mother   . Hypertension Father   . Asthma Sister   . Diabetes Maternal Grandmother   . Dementia Maternal Grandmother   . Glaucoma Maternal Grandfather    History  Substance Use Topics  . Smoking status: Current Every Day Smoker -- 1.00 packs/day for 25 years    Types: Cigarettes  . Smokeless tobacco: Never Used     Comment: electronic cig sometimes  . Alcohol Use: Yes     Comment: daily 1-2 beer every day to twice a week     Review of Systems  Constitutional: Negative for fever.  HENT: Negative for congestion, drooling, ear discharge, ear pain, facial swelling, mouth sores, sore throat, trouble swallowing and voice change.   Respiratory: Negative for shortness of breath.   Cardiovascular: Negative for chest pain.  Gastrointestinal: Negative for abdominal pain.  Neurological: Negative for headaches.  All other systems reviewed and are negative. Infant in the home how is sick    Allergies  Review of patient's  allergies indicates no known allergies.  Home Medications   Prior to Admission medications   Medication Sig Start Date End Date Taking? Authorizing Provider  Abacavir-Dolutegravir-Lamivud 600-50-300 MG TABS Take 1 tablet by mouth daily. 05/19/13  Yes Gardiner Barefootobert W Comer, MD  traZODone (DESYREL) 50 MG tablet Take 1 tablet (50 mg total) by mouth at bedtime. 05/19/13  Yes Gardiner Barefootobert W Comer, MD   BP 115/68  Temp(Src) 98.3 F (36.8 C) (Oral)  Resp 18  SpO2 100% Physical Exam  Constitutional: He is oriented to person, place, and time. He appears well-developed and well-nourished.  HENT:  Head: Normocephalic and atraumatic.  Mouth/Throat: Oropharynx is clear and moist. No oropharyngeal exudate.  No ulcers nor signs of thrush  Eyes: Conjunctivae and EOM are normal. Pupils are equal, round, and reactive to light.  Neck: Normal range of motion. Neck supple. No tracheal deviation present.  No pain with displacement of the trachea  Cardiovascular: Normal rate, regular rhythm, normal heart sounds and intact distal pulses.   Pulmonary/Chest: Effort normal and breath sounds normal. He has no wheezes. He has no rales.  Abdominal: Soft. Bowel sounds are normal. There is no tenderness. There is no rebound and no guarding.  Musculoskeletal: Normal range of motion.  Lymphadenopathy:    He has no cervical adenopathy.  Neurological: He is alert and oriented to person, place, and time.  Skin: Skin is warm and dry.  Psychiatric: He has a normal mood and affect.    ED Course  Procedures (including critical care time) Labs Review Labs Reviewed  RAPID STREP SCREEN  CULTURE, GROUP A STREP    Imaging Review No results found.   EKG Interpretation None      MDM   Final diagnoses:  None    Will treat with pain medication.  Follow up with your doctor in 2 days for recheck.  Return for swelling inability to swallow fevers > 101 or any concerns.      Jasmine Awe, MD 08/31/13 [phone]

## 2013-09-01 LAB — CULTURE, GROUP A STREP

## 2013-10-06 ENCOUNTER — Ambulatory Visit: Payer: Self-pay

## 2013-10-21 ENCOUNTER — Other Ambulatory Visit: Payer: Self-pay

## 2013-10-21 ENCOUNTER — Ambulatory Visit: Payer: Self-pay

## 2013-11-04 ENCOUNTER — Ambulatory Visit: Payer: Self-pay | Admitting: Internal Medicine

## 2014-01-11 ENCOUNTER — Ambulatory Visit: Payer: Self-pay

## 2014-01-11 ENCOUNTER — Other Ambulatory Visit (INDEPENDENT_AMBULATORY_CARE_PROVIDER_SITE_OTHER): Payer: Self-pay

## 2014-01-11 DIAGNOSIS — B20 Human immunodeficiency virus [HIV] disease: Secondary | ICD-10-CM

## 2014-01-11 LAB — CBC WITH DIFFERENTIAL/PLATELET
BASOS ABS: 0 10*3/uL (ref 0.0–0.1)
BASOS PCT: 1 % (ref 0–1)
Eosinophils Absolute: 0 10*3/uL (ref 0.0–0.7)
Eosinophils Relative: 1 % (ref 0–5)
HCT: 38.3 % — ABNORMAL LOW (ref 39.0–52.0)
Hemoglobin: 12.9 g/dL — ABNORMAL LOW (ref 13.0–17.0)
LYMPHS PCT: 52 % — AB (ref 12–46)
Lymphs Abs: 1.9 10*3/uL (ref 0.7–4.0)
MCH: 31.7 pg (ref 26.0–34.0)
MCHC: 33.7 g/dL (ref 30.0–36.0)
MCV: 94.1 fL (ref 78.0–100.0)
MPV: 12.6 fL — AB (ref 9.4–12.4)
Monocytes Absolute: 0.4 10*3/uL (ref 0.1–1.0)
Monocytes Relative: 10 % (ref 3–12)
NEUTROS ABS: 1.3 10*3/uL — AB (ref 1.7–7.7)
NEUTROS PCT: 36 % — AB (ref 43–77)
PLATELETS: 76 10*3/uL — AB (ref 150–400)
RBC: 4.07 MIL/uL — AB (ref 4.22–5.81)
RDW: 13.7 % (ref 11.5–15.5)
WBC: 3.7 10*3/uL — ABNORMAL LOW (ref 4.0–10.5)

## 2014-01-11 LAB — COMPLETE METABOLIC PANEL WITH GFR
ALK PHOS: 85 U/L (ref 39–117)
ALT: 9 U/L (ref 0–53)
AST: 20 U/L (ref 0–37)
Albumin: 3.9 g/dL (ref 3.5–5.2)
BUN: 18 mg/dL (ref 6–23)
CALCIUM: 9.1 mg/dL (ref 8.4–10.5)
CO2: 27 mEq/L (ref 19–32)
Chloride: 102 mEq/L (ref 96–112)
Creat: 0.9 mg/dL (ref 0.50–1.35)
GFR, Est African American: >89 mL/min
GFR, Est Non African American: >89 mL/min
Glucose, Bld: 77 mg/dL (ref 70–99)
POTASSIUM: 3.8 meq/L (ref 3.5–5.3)
Sodium: 135 mEq/L (ref 135–145)
Total Bilirubin: 0.5 mg/dL (ref 0.2–1.2)
Total Protein: 7.7 g/dL (ref 6.0–8.3)

## 2014-01-12 LAB — T-HELPER CELL (CD4) - (RCID CLINIC ONLY)
CD4 T CELL HELPER: 35 % (ref 33–55)
CD4 T Cell Abs: 660 /uL (ref 400–2700)

## 2014-01-12 LAB — HIV-1 RNA QUANT-NO REFLEX-BLD
HIV 1 RNA QUANT: 7215 {copies}/mL — AB (ref <20)
HIV-1 RNA QUANT, LOG: 3.86 {Log} — AB (ref <1.30)

## 2014-01-13 ENCOUNTER — Telehealth: Payer: Self-pay | Admitting: *Deleted

## 2014-01-13 NOTE — Telephone Encounter (Signed)
-----   Message from Gardiner Barefootobert W Comer, MD sent at 01/13/2014  9:30 AM EST ----- Is he on his medications?  If so, need to add a genotype and integrase inhibitor genotype.  If not, no need.  His labs look like he is not on it at all.  thanks

## 2014-01-13 NOTE — Telephone Encounter (Signed)
Patient is not on medication.  He did not renew his ADAP on time (filled out paperwork 12/7 when he came for labs) and has been out of medication for a few months. His ADAP application is awaiting scripts, signature, labs and then will be submitted by [employer].  He is aware that he needs to repeat this process again in 1 month. He is aware of his follow up appointment 1/7. Andree CossHowell, Kalisa Girtman M, RN

## 2014-01-14 ENCOUNTER — Other Ambulatory Visit: Payer: Self-pay | Admitting: *Deleted

## 2014-01-14 MED ORDER — ABACAVIR-DOLUTEGRAVIR-LAMIVUD 600-50-300 MG PO TABS: 1.0000 | ORAL_TABLET | Freq: Every day | ORAL | Status: DC

## 2014-02-11 ENCOUNTER — Ambulatory Visit (INDEPENDENT_AMBULATORY_CARE_PROVIDER_SITE_OTHER): Payer: Self-pay | Admitting: *Deleted

## 2014-02-11 ENCOUNTER — Ambulatory Visit (INDEPENDENT_AMBULATORY_CARE_PROVIDER_SITE_OTHER): Payer: Self-pay | Admitting: Internal Medicine

## 2014-02-11 ENCOUNTER — Telehealth: Payer: Self-pay | Admitting: *Deleted

## 2014-02-11 ENCOUNTER — Encounter: Payer: Self-pay | Admitting: Internal Medicine

## 2014-02-11 VITALS — BP 114/75 | HR 60 | Temp 98.3°F | Wt 139.0 lb

## 2014-02-11 DIAGNOSIS — B20 Human immunodeficiency virus [HIV] disease: Secondary | ICD-10-CM

## 2014-02-11 DIAGNOSIS — K4090 Unilateral inguinal hernia, without obstruction or gangrene, not specified as recurrent: Secondary | ICD-10-CM | POA: Insufficient documentation

## 2014-02-11 DIAGNOSIS — Z23 Encounter for immunization: Secondary | ICD-10-CM

## 2014-02-11 NOTE — Progress Notes (Signed)
   Subjective:    Patient ID: Frank Stark, male    DOB: [birth year], 34 y.o.   MRN: [id number]  HPI  He comes in here for follow-up of HIV off of his medications. He was previously on Stribild then changed to Triumeq so she could cut the medication and then became undetectable but unfortunately did not renew his drug assistance and has been off the medication.  His last CD4 count is 660 done last month and his viral load is up to 7000 copies, which is about his baseline.  Review of Systems  Constitutional: Negative for fatigue.  Gastrointestinal: Negative for nausea and diarrhea.  Genitourinary:       He gets left groin bulge with lifting, is reducible.  Skin: Negative for rash.  Neurological: Negative for dizziness and light-headedness.       Objective:   Physical Exam  Constitutional: He appears well-developed and well-nourished. No distress.  HENT:  Mouth/Throat: No oropharyngeal exudate.  Eyes: No scleral icterus.  Cardiovascular: Normal rate, regular rhythm and normal heart sounds.   No murmur heard. Pulmonary/Chest: Effort normal and breath sounds normal. No respiratory distress.  Musculoskeletal:  Left groin bulge  Lymphadenopathy:    He has no cervical adenopathy.  Skin: No rash noted.          Assessment & Plan:

## 2014-02-11 NOTE — Assessment & Plan Note (Signed)
We are awaiting his drug assistance approval. He will restart Triumeq and then have blood work 3-4 weeks after starting. I emphasized the need to remain vigilant on renewing his drug assistance so he has no lapses in care or medication.

## 2014-02-11 NOTE — Telephone Encounter (Signed)
[facility]14 at 10:15 AM. Office note faxed to [phone]. They are mailing a new patient packet to the patient with directions and appointment info. Frank MolaJacqueline Cockerham

## 2014-02-11 NOTE — Assessment & Plan Note (Signed)
We'll refer him to [facility] for surgery for consideration of hernia repair if indicated.

## 2014-02-18 ENCOUNTER — Other Ambulatory Visit: Payer: Self-pay | Admitting: *Deleted

## 2014-02-18 DIAGNOSIS — B20 Human immunodeficiency virus [HIV] disease: Secondary | ICD-10-CM

## 2014-02-18 MED ORDER — ABACAVIR-DOLUTEGRAVIR-LAMIVUD 600-50-300 MG PO TABS: 1.0000 | ORAL_TABLET | Freq: Every day | ORAL | Status: DC

## 2014-02-18 NOTE — Telephone Encounter (Signed)
ADAP Application 

## 2014-03-15 ENCOUNTER — Other Ambulatory Visit: Payer: Self-pay

## 2014-03-29 ENCOUNTER — Ambulatory Visit: Payer: Self-pay | Admitting: Internal Medicine

## 2014-03-29 ENCOUNTER — Telehealth: Payer: Self-pay | Admitting: *Deleted

## 2014-03-29 NOTE — Telephone Encounter (Signed)
Contacted patient regarding today's no show. Patient was late leaving work, could not be here before 4.  He is rescheduled to 3/3. Andree CossHowell, Michelle M, RN

## 2014-04-08 ENCOUNTER — Telehealth: Payer: Self-pay | Admitting: *Deleted

## 2014-04-08 ENCOUNTER — Ambulatory Visit: Payer: Self-pay | Admitting: Internal Medicine

## 2014-04-08 NOTE — Telephone Encounter (Signed)
Left message requesting pt call [facility] for a new appt. 

## 2014-05-27 IMAGING — CR DG CHEST 2V
2 series · 2 of 2 positions shown · non-contrast
Comparison: 03/09/2011

CLINICAL DATA: Right-sided chest pain

CHEST - 2 VIEW

[w chest pa]
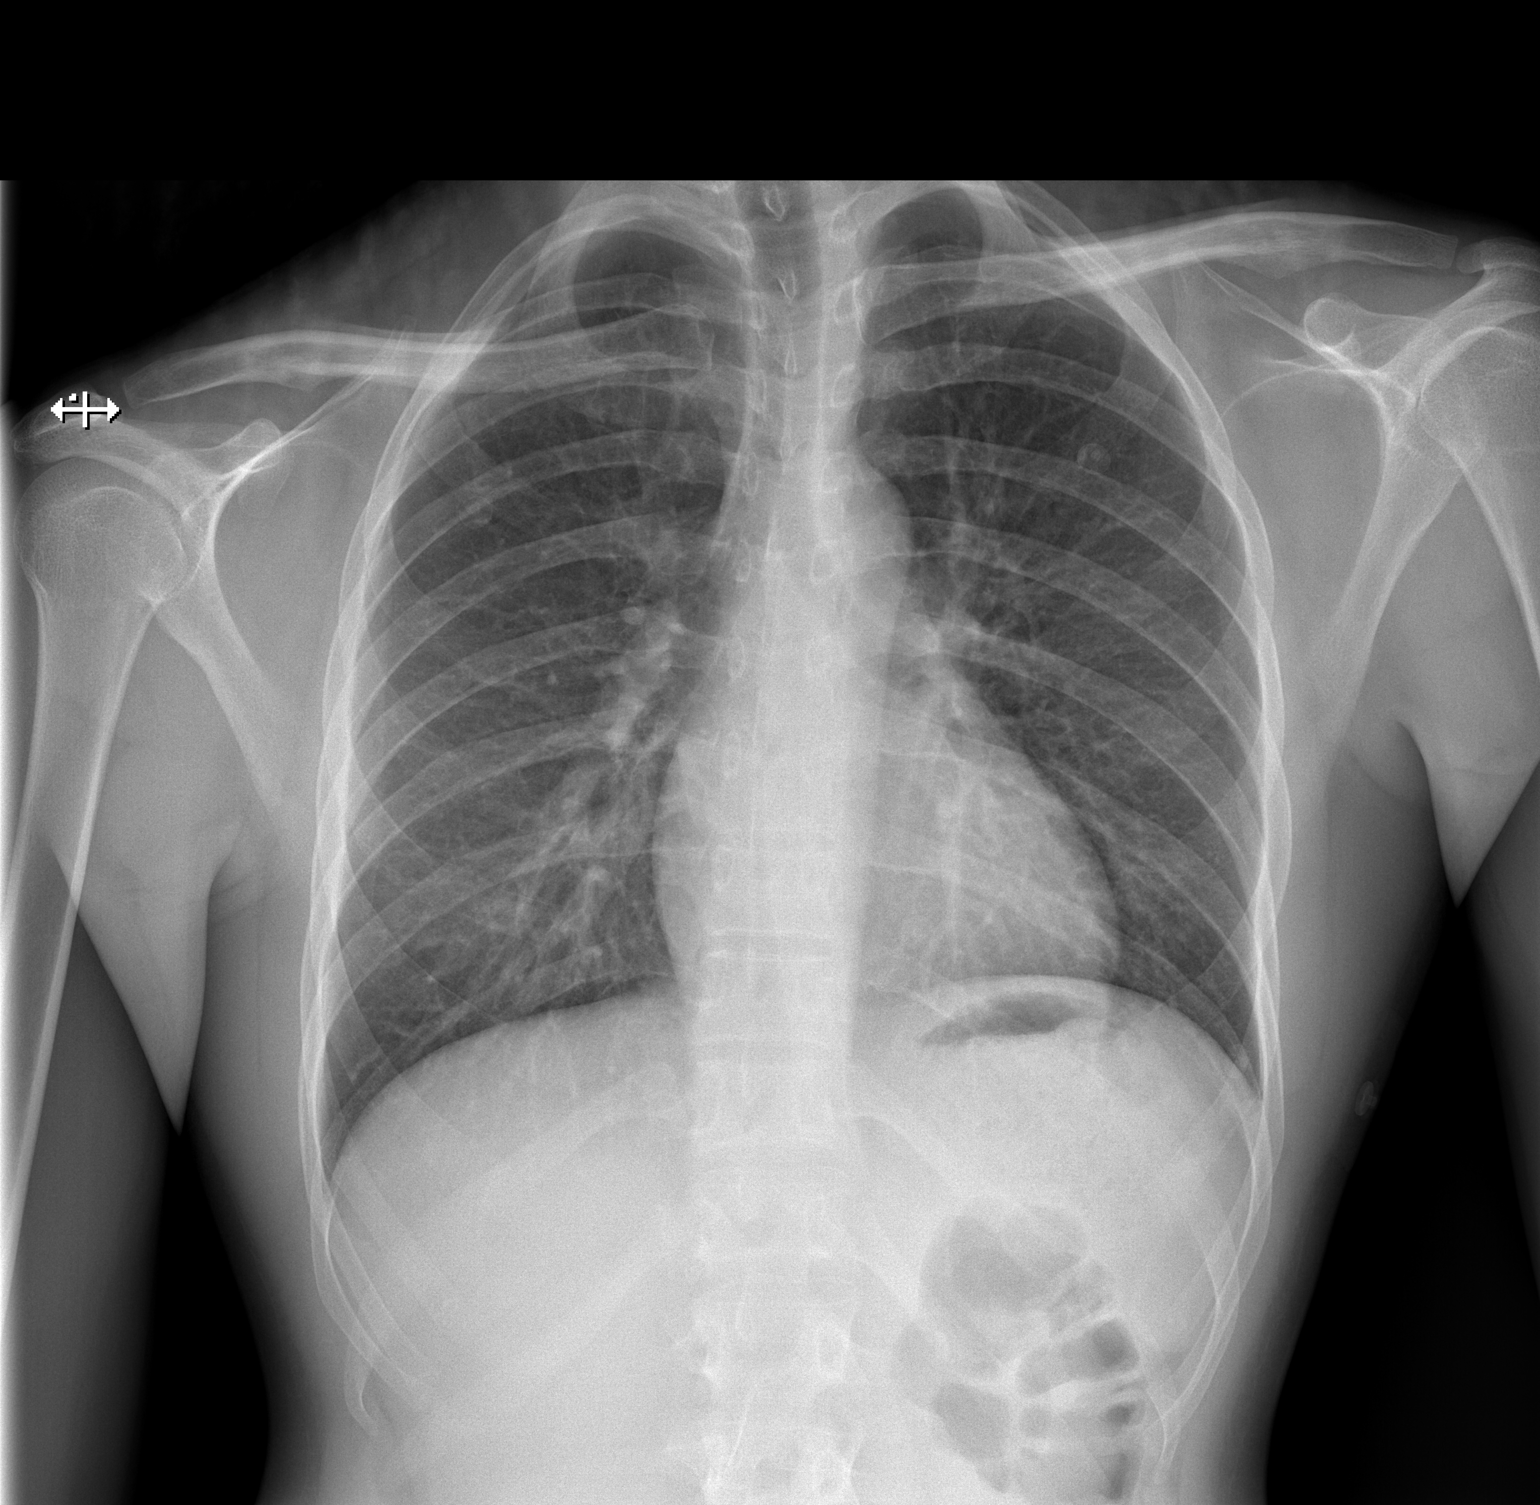

[w chest lat]
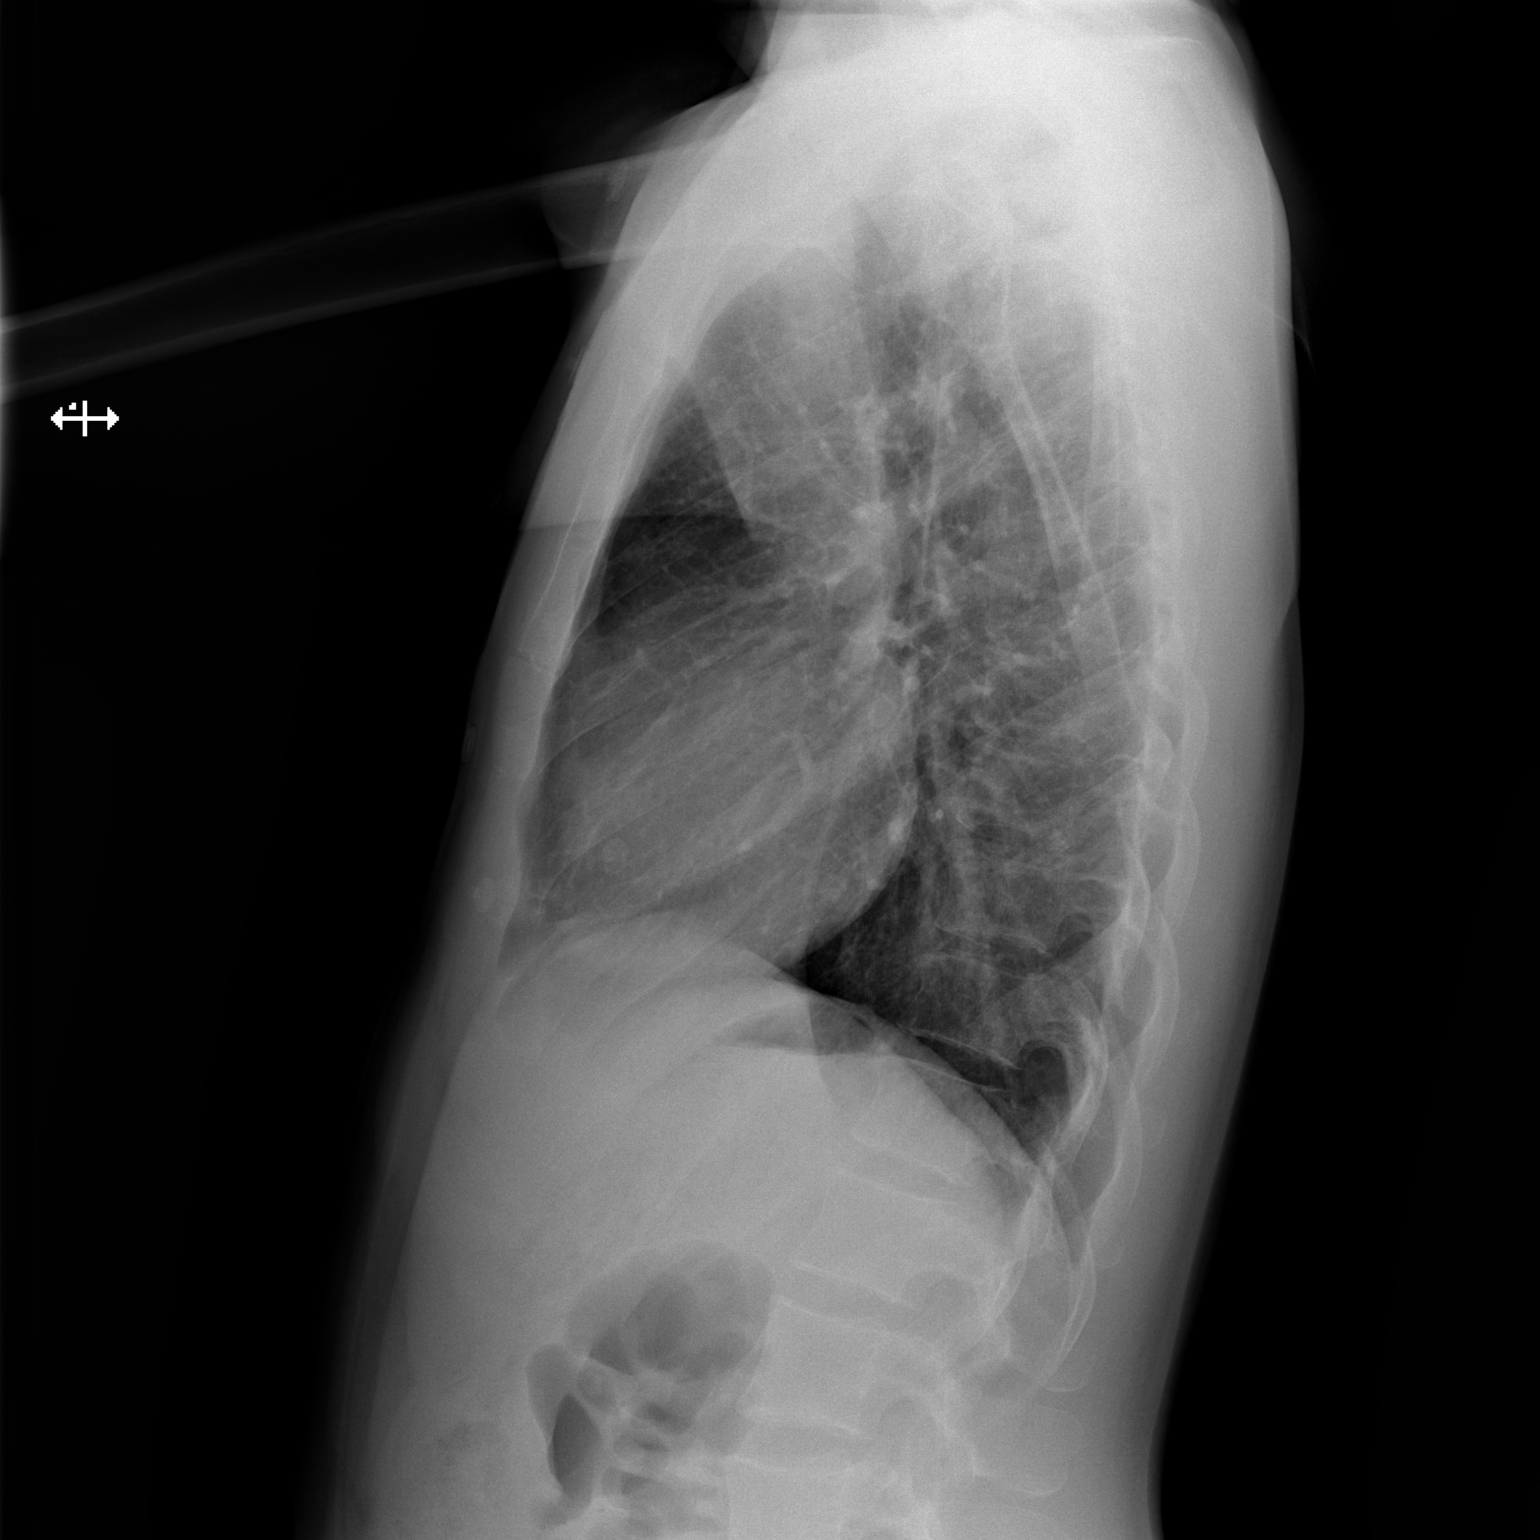

[2 of 2 positions shown; findings below may reference images not displayed]

FINDINGS: The heart and pulmonary vascularity are within normal
limits.  The lungs are clear bilaterally.  No acute bony
abnormality is seen.
IMPRESSION: No acute abnormality noted.

## 2014-08-01 ENCOUNTER — Encounter (HOSPITAL_COMMUNITY): Payer: Self-pay | Admitting: Emergency Medicine

## 2014-08-01 ENCOUNTER — Emergency Department (HOSPITAL_COMMUNITY)
Admission: EM | Admit: 2014-08-01 | Discharge: 2014-08-01 | Disposition: A | Discharge status: Home / Self Care | Payer: Self-pay | Attending: Emergency Medicine | Admitting: Emergency Medicine

## 2014-08-01 DIAGNOSIS — R110 Nausea: Secondary | ICD-10-CM | POA: Insufficient documentation

## 2014-08-01 DIAGNOSIS — R197 Diarrhea, unspecified: Secondary | ICD-10-CM | POA: Insufficient documentation

## 2014-08-01 DIAGNOSIS — Z202 Contact with and (suspected) exposure to infections with a predominantly sexual mode of transmission: Secondary | ICD-10-CM | POA: Insufficient documentation

## 2014-08-01 DIAGNOSIS — R109 Unspecified abdominal pain: Secondary | ICD-10-CM | POA: Insufficient documentation

## 2014-08-01 DIAGNOSIS — Z21 Asymptomatic human immunodeficiency virus [HIV] infection status: Secondary | ICD-10-CM | POA: Insufficient documentation

## 2014-08-01 DIAGNOSIS — Z720 Tobacco use: Secondary | ICD-10-CM | POA: Insufficient documentation

## 2014-08-01 DIAGNOSIS — Z79899 Other long term (current) drug therapy: Secondary | ICD-10-CM | POA: Insufficient documentation

## 2014-08-01 LAB — COMPREHENSIVE METABOLIC PANEL
ALT: 11 U/L — AB (ref 17–63)
AST: 20 U/L (ref 15–41)
Albumin: 3.3 g/dL — ABNORMAL LOW (ref 3.5–5.0)
Alkaline Phosphatase: 85 U/L (ref 38–126)
Anion gap: 8 (ref 5–15)
BUN: 17 mg/dL (ref 6–20)
CALCIUM: 8.9 mg/dL (ref 8.9–10.3)
CHLORIDE: 105 mmol/L (ref 101–111)
CO2: 25 mmol/L (ref 22–32)
CREATININE: 0.88 mg/dL (ref 0.61–1.24)
GFR calc Af Amer: >60 mL/min (ref >60)
GLUCOSE: 90 mg/dL (ref 65–99)
Potassium: 3.4 mmol/L — ABNORMAL LOW (ref 3.5–5.1)
Sodium: 138 mmol/L (ref 135–145)
TOTAL PROTEIN: 7.4 g/dL (ref 6.5–8.1)
Total Bilirubin: 0.2 mg/dL — ABNORMAL LOW (ref 0.3–1.2)

## 2014-08-01 LAB — CBC WITH DIFFERENTIAL/PLATELET
Basophils Absolute: 0 10*3/uL (ref 0.0–0.1)
Basophils Relative: 1 % (ref 0–1)
EOS ABS: 0.1 10*3/uL (ref 0.0–0.7)
EOS PCT: 3 % (ref 0–5)
HCT: 38.1 % — ABNORMAL LOW (ref 39.0–52.0)
Hemoglobin: 12.8 g/dL — ABNORMAL LOW (ref 13.0–17.0)
LYMPHS PCT: 61 % — AB (ref 12–46)
Lymphs Abs: 2 10*3/uL (ref 0.7–4.0)
MCH: 31.6 pg (ref 26.0–34.0)
MCHC: 33.6 g/dL (ref 30.0–36.0)
MCV: 94.1 fL (ref 78.0–100.0)
MONO ABS: 0.4 10*3/uL (ref 0.1–1.0)
Monocytes Relative: 11 % (ref 3–12)
Neutro Abs: 0.8 10*3/uL — ABNORMAL LOW (ref 1.7–7.7)
Neutrophils Relative %: 24 % — ABNORMAL LOW (ref 43–77)
Platelets: 118 10*3/uL — ABNORMAL LOW (ref 150–400)
RBC: 4.05 MIL/uL — ABNORMAL LOW (ref 4.22–5.81)
RDW: 13.2 % (ref 11.5–15.5)
WBC: 3.3 10*3/uL — AB (ref 4.0–10.5)

## 2014-08-01 MED ORDER — CEFTRIAXONE SODIUM 250 MG IJ SOLR
250.0000 mg | Freq: Once | INTRAMUSCULAR | Status: AC
  Administered 2014-08-01: 250 mg via INTRAMUSCULAR
  Filled 2014-08-01: qty 250

## 2014-08-01 MED ORDER — LIDOCAINE HCL (PF) 1 % IJ SOLN
INTRAMUSCULAR | Status: AC
  Administered 2014-08-01: 5 mL
  Filled 2014-08-01: qty 5

## 2014-08-01 MED ORDER — ONDANSETRON 4 MG PO TBDP
8.0000 mg | ORAL_TABLET | Freq: Once | ORAL | Status: AC
Start: 2014-08-01 — End: 2014-08-01
  Administered 2014-08-01: 8 mg via ORAL
  Filled 2014-08-01: qty 2

## 2014-08-01 MED ORDER — AZITHROMYCIN 250 MG PO TABS
1000.0000 mg | ORAL_TABLET | Freq: Once | ORAL | Status: AC
  Administered 2014-08-01: 1000 mg via ORAL
  Filled 2014-08-01: qty 4

## 2014-08-01 NOTE — ED Notes (Signed)
MD at bedside. 

## 2014-08-01 NOTE — ED Notes (Signed)
Pt presents to the department with abdominal pain, n/d. Pt also reports he has been exposed to STDs and would like to be treated for such.

## 2014-08-01 NOTE — Discharge Instructions (Signed)
See your ID doctor in 1 week.   Safe Sex Safe sex is about reducing the risk of giving or getting a sexually transmitted disease (STD). STDs are spread through sexual contact involving the genitals, mouth, or rectum. Some STDs can be cured and others cannot. Safe sex can also prevent unintended pregnancies.  WHAT ARE SOME SAFE SEX PRACTICES?  Limit your sexual activity to only one partner who is having sex with only you.  Talk to your partner about his or her past partners, past STDs, and drug use.  Use a condom every time you have sexual intercourse. This includes vaginal, oral, and anal sexual activity. Both females and males should wear condoms during oral sex. Only use latex or polyurethane condoms and water-based lubricants. Using petroleum-based lubricants or oils to lubricate a condom will weaken the condom and increase the chance that it will break. The condom should be in place from the beginning to the end of sexual activity. Wearing a condom reduces, but does not completely eliminate, your risk of getting or giving an STD. STDs can be spread by contact with infected body fluids and skin.  Get vaccinated for hepatitis B and HPV.  Avoid alcohol and recreational drugs, which can affect your judgment. You may forget to use a condom or participate in high-risk sex.  For females, avoid douching after sexual intercourse. Douching can spread an infection farther into the reproductive tract.  Check your body for signs of sores, blisters, rashes, or unusual discharge. See your health care provider if you notice any of these signs.  Avoid sexual contact if you have symptoms of an infection or are being treated for an STD. If you or your partner has herpes, avoid sexual contact when blisters are present. Use condoms at all other times.  If you are at risk of being infected with HIV, it is recommended that you take a prescription medicine daily to prevent HIV infection. This is called  pre-exposure prophylaxis (PrEP). You are considered at risk if:  You are a man who has sex with other men (MSM).  You are a heterosexual man or woman who is sexually active with more than one partner.  You take drugs by injection.  You are sexually active with a partner who has HIV.  Talk with your health care provider about whether you are at high risk of being infected with HIV. If you choose to begin PrEP, you should first be tested for HIV. You should then be tested every 3 months for as long as you are taking PrEP.  See your health care provider for regular screenings, exams, and tests for other STDs. Before having sex with a new partner, each of you should be screened for STDs and should talk about the results with each other. WHAT ARE THE BENEFITS OF SAFE SEX?   There is less chance of getting or giving an STD.  You can prevent unwanted or unintended pregnancies.  By discussing safe sex concerns with your partner, you may increase feelings of intimacy, comfort, trust, and honesty between the two of you. Document Released: 03/01/2004 Document Revised: 06/08/2013 Document Reviewed: 07/16/2011 [facility] Patient Information 2015 [place], [place]. This information is not intended to replace advice given to you by your health care provider. Make sure you discuss any questions you have with your health care provider.  Sexually Transmitted Disease A sexually transmitted disease (STD) is a disease or infection often passed to another person during sex. However, STDs can be passed through  nonsexual ways. An STD can be passed through:  Spit (saliva).  Semen.  Blood.  Mucus from the vagina.  Pee (urine). HOW CAN I LESSEN MY CHANCES OF GETTING AN STD?  Use:  Latex condoms.  Water-soluble lubricants with condoms. Do not use petroleum jelly or oils.  Dental dams. These are small pieces of latex that are used as a barrier during oral sex.  Avoid having more than one sex  partner.  Do not have sex with someone who has other sex partners.  Do not have sex with anyone you do not know or who is at high risk for an STD.  Avoid risky sex that can break your skin.  Do not have sex if you have open sores on your mouth or skin.  Avoid drinking too much alcohol or taking illegal drugs. Alcohol and drugs can affect your good judgment.  Avoid oral and anal sex acts.  Get shots (vaccines) for HPV and hepatitis.  If you are at risk of being infected with HIV, it is advised that you take a certain medicine daily to prevent HIV infection. This is called pre-exposure prophylaxis (PrEP). You may be at risk if:  You are a man who has sex with other men (MSM).  You are attracted to the opposite sex (heterosexual) and are having sex with more than one partner.  You take drugs with a needle.  You have sex with someone who has HIV.  Talk with your doctor about if you are at high risk of being infected with HIV. If you begin to take PrEP, get tested for HIV first. Get tested every 3 months for as long as you are taking PrEP. WHAT SHOULD I DO IF I THINK I HAVE AN STD?  See your doctor.  Tell your sex partner(s) that you have an STD. They should be tested and treated.  Do not have sex until your doctor says it is okay. WHEN SHOULD I GET HELP? Get help right away if:  You have bad belly (abdominal) pain.  You are a man and have puffiness (swelling) or pain in your testicles.  You are a woman and have puffiness in your vagina. Document Released: 03/01/2004 Document Revised: 01/27/2013 Document Reviewed: 07/18/2012 [facility] Patient Information 2015 [place], [place]. This information is not intended to replace advice given to you by your health care provider. Make sure you discuss any questions you have with your health care provider. Diarrhea Diarrhea is frequent loose and watery bowel movements. It can cause you to feel weak and dehydrated. Dehydration can cause you  to become tired and thirsty, have a dry mouth, and have decreased urination that often is dark yellow. Diarrhea is a sign of another problem, most often an infection that will not last long. In most cases, diarrhea typically lasts 2-3 days. However, it can last longer if it is a sign of something more serious. It is important to treat your diarrhea as directed by your caregiver to lessen or prevent future episodes of diarrhea. CAUSES  Some common causes include:  Gastrointestinal infections caused by viruses, bacteria, or parasites.  Food poisoning or food allergies.  Certain medicines, such as antibiotics, chemotherapy, and laxatives.  Artificial sweeteners and fructose.  Digestive disorders. HOME CARE INSTRUCTIONS  Ensure adequate fluid intake (hydration): Have 1 cup (8 oz) of fluid for each diarrhea episode. Avoid fluids that contain simple sugars or sports drinks, fruit juices, whole milk products, and sodas. Your urine should be clear or pale yellow  if you are drinking enough fluids. Hydrate with an oral rehydration solution that you can purchase at pharmacies, retail stores, and online. You can prepare an oral rehydration solution at home by mixing the following ingredients together:   - tsp table salt.   tsp baking soda.   tsp salt substitute containing potassium chloride.  1  tablespoons sugar.  1 L (34 oz) of water.  Certain foods and beverages may increase the speed at which food moves through the gastrointestinal (GI) tract. These foods and beverages should be avoided and include:  Caffeinated and alcoholic beverages.  High-fiber foods, such as raw fruits and vegetables, nuts, seeds, and whole grain breads and cereals.  Foods and beverages sweetened with sugar alcohols, such as xylitol, sorbitol, and mannitol.  Some foods may be well tolerated and may help thicken stool including:  Starchy foods, such as rice, toast, pasta, low-sugar cereal, oatmeal, grits, baked  potatoes, crackers, and bagels.  Bananas.  Applesauce.  Add probiotic-rich foods to help increase healthy bacteria in the GI tract, such as yogurt and fermented milk products.  Wash your hands well after each diarrhea episode.  Only take over-the-counter or prescription medicines as directed by your caregiver.  Take a warm bath to relieve any burning or pain from frequent diarrhea episodes. SEEK IMMEDIATE MEDICAL CARE IF:   You are unable to keep fluids down.  You have persistent vomiting.  You have blood in your stool, or your stools are black and tarry.  You do not urinate in 6-8 hours, or there is only a small amount of very dark urine.  You have abdominal pain that increases or localizes.  You have weakness, dizziness, confusion, or light-headedness.  You have a severe headache.  Your diarrhea gets worse or does not get better.  You have a fever or persistent symptoms for more than 2-3 days.  You have a fever and your symptoms suddenly get worse. MAKE SURE YOU:   Understand these instructions.  Will watch your condition.  Will get help right away if you are not doing well or get worse. Document Released: 01/12/2002 Document Revised: 06/08/2013 Document Reviewed: 09/30/2011 [facility] Patient Information 2015 [place], [place]. This information is not intended to replace advice given to you by your health care provider. Make sure you discuss any questions you have with your health care provider.

## 2014-08-01 NOTE — ED Provider Notes (Signed)
CSN: [id number]     Arrival date & time 08/01/14  0406 History   First MD Initiated Contact with Patient 08/01/14 [phone]     Chief Complaint  Patient presents with  . Abdominal Pain  . Diarrhea     (Consider location/radiation/quality/duration/timing/severity/associated sxs/prior Treatment) HPI Comments: Pt comes in with abd pain and STD exposure. Abd pain is periumbilical and started yday. Pt has nausea, no emesis. Pt has had 3 loose BM in the last 24 hours. No blood or mucus with that. Pt's pain is intermittent No recent known exposure to someone with similar symptoms, no suspicious po intake. Pt has HIV hx. He doesn't have AIDS. No fevers. Pt also reports that he had unprotected intercourse with a man, and he received a call yday from that person stating that they were diagnosed with Gonorrhea and chlamydia. PT has no symptoms - no sore throat, no discharge, no uti like sx.   ROS 10 Systems reviewed and are negative for acute change except as noted in the HPI.     Patient is a 34 y.o. male presenting with abdominal pain and diarrhea. The history is provided by the patient.  Abdominal Pain Associated symptoms: diarrhea and nausea   Diarrhea Associated symptoms: abdominal pain     Past Medical History  Diagnosis Date  . HIV (human immunodeficiency virus infection)    History reviewed. No pertinent past surgical history. Family History  Problem Relation Age of Onset  . Hypertension Mother   . Hypertension Father   . Asthma Sister   . Diabetes Maternal Grandmother   . Dementia Maternal Grandmother   . Glaucoma Maternal Grandfather    History  Substance Use Topics  . Smoking status: Current Every Day Smoker -- 0.50 packs/day for 25 years    Types: Cigarettes  . Smokeless tobacco: Never Used  . Alcohol Use: 0.0 oz/week    0 Standard drinks or equivalent per week     Comment: daily 1-2 beer every day to twice a week     Review of Systems  Gastrointestinal: Positive for  nausea, abdominal pain and diarrhea.  All other systems reviewed and are negative.     Allergies  Review of patient's allergies indicates no known allergies.  Home Medications   Prior to Admission medications   Medication Sig Start Date End Date Taking? Authorizing Provider  Abacavir-Dolutegravir-Lamivud 600-50-300 MG TABS Take 1 tablet by mouth daily. 02/18/14   Gardiner Barefoot, MD   BP 98/52 mmHg  Pulse 55  Temp(Src) 98.3 F (36.8 C) (Oral)  Resp 18  Ht 5\' 10"  (1.778 m)  Wt 137 lb (62.143 kg)  BMI 19.66 kg/m2  SpO2 100% Physical Exam  Constitutional: He is oriented to person, place, and time. He appears well-developed.  HENT:  Head: Atraumatic.  Neck: Neck supple.  Cardiovascular: Normal rate.   Pulmonary/Chest: Effort normal.  Abdominal: He exhibits no distension. There is no tenderness.  Neurological: He is alert and oriented to person, place, and time.  Skin: Skin is warm.  Nursing note and vitals reviewed.   ED Course  Procedures (including critical care time) Labs Review Labs Reviewed  CBC WITH DIFFERENTIAL/PLATELET - Abnormal; Notable for the following:    WBC 3.3 (*)    RBC 4.05 (*)    Hemoglobin 12.8 (*)    HCT 38.1 (*)    Platelets 118 (*)    Neutrophils Relative % 24 (*)    Lymphocytes Relative 61 (*)    Neutro Abs 0.8 (*)  All other components within normal limits  COMPREHENSIVE METABOLIC PANEL - Abnormal; Notable for the following:    Potassium 3.4 (*)    Albumin 3.3 (*)    ALT 11 (*)    Total Bilirubin 0.2 (*)    All other components within normal limits    Imaging Review No results found.   EKG Interpretation None      MDM   Final diagnoses:  STD exposure  Abdominal discomfort    Pt comes in with STD exposure - and we gave him the appropriate antibiotics. Safe sex importance counseled. Pt has loose BM- has HIV. No fevers, no mucus, no blood. Unable to give sample here-  Asked to see his ID doctor.    Derwood Kaplan,  MD 08/01/14 [phone]

## 2014-08-03 ENCOUNTER — Telehealth (HOSPITAL_COMMUNITY): Payer: Self-pay

## 2014-08-03 LAB — PATHOLOGIST SMEAR REVIEW

## 2014-08-09 ENCOUNTER — Telehealth (HOSPITAL_COMMUNITY): Payer: Self-pay

## 2015-01-04 ENCOUNTER — Encounter (HOSPITAL_COMMUNITY): Payer: Self-pay | Admitting: Emergency Medicine

## 2015-01-04 ENCOUNTER — Emergency Department (HOSPITAL_COMMUNITY)
Admission: EM | Admit: 2015-01-04 | Discharge: 2015-01-04 | Disposition: A | Discharge status: Home / Self Care | Payer: Self-pay | Attending: Emergency Medicine | Admitting: Emergency Medicine

## 2015-01-04 DIAGNOSIS — F17210 Nicotine dependence, cigarettes, uncomplicated: Secondary | ICD-10-CM | POA: Insufficient documentation

## 2015-01-04 DIAGNOSIS — Z21 Asymptomatic human immunodeficiency virus [HIV] infection status: Secondary | ICD-10-CM | POA: Insufficient documentation

## 2015-01-04 DIAGNOSIS — H109 Unspecified conjunctivitis: Secondary | ICD-10-CM | POA: Insufficient documentation

## 2015-01-04 DIAGNOSIS — Z79899 Other long term (current) drug therapy: Secondary | ICD-10-CM | POA: Insufficient documentation

## 2015-01-04 DIAGNOSIS — H11432 Conjunctival hyperemia, left eye: Secondary | ICD-10-CM

## 2015-01-04 MED ORDER — POLYMYXIN B-TRIMETHOPRIM 10000-0.1 UNIT/ML-% OP SOLN
1.0000 [drp] | OPHTHALMIC | Status: DC
  Administered 2015-01-04: 1 [drp] via OPHTHALMIC
  Filled 2015-01-04: qty 10

## 2015-01-04 NOTE — ED Notes (Signed)
[facility]

## 2015-01-04 NOTE — ED Provider Notes (Signed)
CSN: [id number]     Arrival date & time 01/04/15  0241 History   First MD Initiated Contact with Patient 01/04/15 [phone]     Chief Complaint  Patient presents with  . Conjunctivitis     (Consider location/radiation/quality/duration/timing/severity/associated sxs/prior Treatment) HPI Comments: 4 days of red watery left eye using OTC drops with no relief  Patient is a 34 y.o. male presenting with conjunctivitis.  Conjunctivitis This is a new problem. The current episode started in the past 7 days. The problem occurs constantly. The problem has been gradually worsening. Pertinent negatives include no congestion, fever, headaches or nausea. Nothing aggravates the symptoms. Treatments tried: OTC drops. The treatment provided no relief.    Past Medical History  Diagnosis Date  . HIV (human immunodeficiency virus infection) (HCC)    History reviewed. No pertinent past surgical history. Family History  Problem Relation Age of Onset  . Hypertension Mother   . Hypertension Father   . Asthma Sister   . Diabetes Maternal Grandmother   . Dementia Maternal Grandmother   . Glaucoma Maternal Grandfather    Social History  Substance Use Topics  . Smoking status: Current Every Day Smoker -- 0.00 packs/day for 0 years    Types: Cigarettes  . Smokeless tobacco: Never Used  . Alcohol Use: Yes    Review of Systems  Constitutional: Negative for fever.  HENT: Negative for congestion.   Eyes: Positive for redness and itching. Negative for photophobia, pain and visual disturbance.  Respiratory: Negative for shortness of breath.   Gastrointestinal: Negative for nausea.  Neurological: Negative for dizziness and headaches.  All other systems reviewed and are negative.     Allergies  Review of patient's allergies indicates no known allergies.  Home Medications   Prior to Admission medications   Medication Sig Start Date End Date Taking? Authorizing Provider  Abacavir-Dolutegravir-Lamivud  600-50-300 MG TABS Take 1 tablet by mouth daily. 02/18/14   Gardiner Barefootobert W Comer, MD   BP 107/71 mmHg  Pulse 64  Temp(Src) 98 F (36.7 C) (Oral)  Resp 16  Ht 5\' 10"  (1.778 m)  Wt 66.225 kg  BMI 20.95 kg/m2  SpO2 97% Physical Exam  Constitutional: He is oriented to person, place, and time. He appears well-developed and well-nourished.  HENT:  Head: Normocephalic.  Eyes: EOM are normal. Pupils are equal, round, and reactive to light. Left conjunctiva is injected. Left conjunctiva has no hemorrhage.  Neck: Normal range of motion.  Cardiovascular: Normal rate and regular rhythm.   Pulmonary/Chest: Effort normal and breath sounds normal.  Musculoskeletal: Normal range of motion.  Lymphadenopathy:    He has no cervical adenopathy.  Neurological: He is alert and oriented to person, place, and time.  Skin: Skin is warm and dry.  Vitals reviewed.   ED Course  Procedures (including critical care time) Labs Review Labs Reviewed - No data to display  Imaging Review No results found. I have personally reviewed and evaluated these images and lab results as part of my medical decision-making.   EKG Interpretation None    will try Polytrim gtts for 5 day if not better pateint will see opthlo  MDM   Final diagnoses:  Conjunctival injection, left         Earley FavorGail Jacqulyne Gladue, NP 01/04/15 [phone]  Dione Boozeavid Glick, MD 01/04/15 [phone]

## 2015-01-04 NOTE — Discharge Instructions (Signed)
Use the drops as directed 2 drops into Left eye every 4 hours while awake for the next 4 days if not getting better in 2 days see the eye doctor

## 2015-01-04 NOTE — ED Notes (Signed)
Pt. reports left eye redness with clear drainage , itchy and swelling onset 4 days ago , denies injury / no blurred vision .

## 2015-02-08 ENCOUNTER — Encounter (HOSPITAL_COMMUNITY): Payer: Self-pay | Admitting: *Deleted

## 2015-02-08 ENCOUNTER — Emergency Department (HOSPITAL_COMMUNITY)
Admission: EM | Admit: 2015-02-08 | Discharge: 2015-02-08 | Disposition: A | Discharge status: Home / Self Care | Payer: Self-pay | Attending: Emergency Medicine | Admitting: Emergency Medicine

## 2015-02-08 ENCOUNTER — Emergency Department (HOSPITAL_COMMUNITY): Payer: Self-pay

## 2015-02-08 DIAGNOSIS — F17210 Nicotine dependence, cigarettes, uncomplicated: Secondary | ICD-10-CM | POA: Insufficient documentation

## 2015-02-08 DIAGNOSIS — J40 Bronchitis, not specified as acute or chronic: Secondary | ICD-10-CM

## 2015-02-08 DIAGNOSIS — J209 Acute bronchitis, unspecified: Secondary | ICD-10-CM | POA: Insufficient documentation

## 2015-02-08 DIAGNOSIS — Z79899 Other long term (current) drug therapy: Secondary | ICD-10-CM | POA: Insufficient documentation

## 2015-02-08 DIAGNOSIS — R197 Diarrhea, unspecified: Secondary | ICD-10-CM | POA: Insufficient documentation

## 2015-02-08 DIAGNOSIS — B20 Human immunodeficiency virus [HIV] disease: Secondary | ICD-10-CM | POA: Insufficient documentation

## 2015-02-08 MED ORDER — AZITHROMYCIN 250 MG PO TABS: 250.0000 mg | ORAL_TABLET | Freq: Every day | ORAL | Status: DC

## 2015-02-08 MED ORDER — BENZONATATE 100 MG PO CAPS: 100.0000 mg | ORAL_CAPSULE | Freq: Three times a day (TID) | ORAL | Status: DC | PRN

## 2015-02-08 NOTE — Discharge Instructions (Signed)
Upper Respiratory Infection, Adult Most upper respiratory infections (URIs) are a viral infection of the air passages leading to the lungs. A URI affects the nose, throat, and upper air passages. The most common type of URI is nasopharyngitis and is typically referred to as "the common cold." URIs run their course and usually go away on their own. Most of the time, a URI does not require medical attention, but sometimes a bacterial infection in the upper airways can follow a viral infection. This is called a secondary infection. Sinus and middle ear infections are common types of secondary upper respiratory infections. Bacterial pneumonia can also complicate a URI. A URI can worsen asthma and chronic obstructive pulmonary disease (COPD). Sometimes, these complications can require emergency medical care and may be life threatening.  CAUSES Almost all URIs are caused by viruses. A virus is a type of germ and can spread from one person to another.  RISKS FACTORS You may be at risk for a URI if:   You smoke.   You have chronic heart or lung disease.  You have a weakened defense (immune) system.   You are very young or very old.   You have nasal allergies or asthma.  You work in crowded or poorly ventilated areas.  You work in health care facilities or schools. SIGNS AND SYMPTOMS  Symptoms typically develop 2-3 days after you come in contact with a cold virus. Most viral URIs last 7-10 days. However, viral URIs from the influenza virus (flu virus) can last 14-18 days and are typically more severe. Symptoms may include:   Runny or stuffy (congested) nose.   Sneezing.   Cough.   Sore throat.   Headache.   Fatigue.   Fever.   Loss of appetite.   Pain in your forehead, behind your eyes, and over your cheekbones (sinus pain).  Muscle aches.  DIAGNOSIS  Your health care provider may diagnose a URI by:  Physical exam.  Tests to check that your symptoms are not due to  another condition such as:  Strep throat.  Sinusitis.  Pneumonia.  Asthma. TREATMENT  A URI goes away on its own with time. It cannot be cured with medicines, but medicines may be prescribed or recommended to relieve symptoms. Medicines may help:  Reduce your fever.  Reduce your cough.  Relieve nasal congestion. HOME CARE INSTRUCTIONS   Take medicines only as directed by your health care provider.   Gargle warm saltwater or take cough drops to comfort your throat as directed by your health care provider.  Use a warm mist humidifier or inhale steam from a shower to increase air moisture. This may make it easier to breathe.  Drink enough fluid to keep your urine clear or pale yellow.   Eat soups and other clear broths and maintain good nutrition.   Rest as needed.   Return to work when your temperature has returned to normal or as your health care provider advises. You may need to stay home longer to avoid infecting others. You can also use a face mask and careful hand washing to prevent spread of the virus.  Increase the usage of your inhaler if you have asthma.   Do not use any tobacco products, including cigarettes, chewing tobacco, or electronic cigarettes. If you need help quitting, ask your health care provider. PREVENTION  The best way to protect yourself from getting a cold is to practice good hygiene.   Avoid oral or hand contact with people with cold  symptoms.   Wash your hands often if contact occurs.  There is no clear evidence that vitamin C, vitamin E, echinacea, or exercise reduces the chance of developing a cold. However, it is always recommended to get plenty of rest, exercise, and practice good nutrition.  SEEK MEDICAL CARE IF:   You are getting worse rather than better.   Your symptoms are not controlled by medicine.   You have chills.  You have worsening shortness of breath.  You have brown or red mucus.  You have yellow or brown nasal  discharge.  You have pain in your face, especially when you bend forward.  You have a fever.  You have swollen neck glands.  You have pain while swallowing.  You have white areas in the back of your throat. SEEK IMMEDIATE MEDICAL CARE IF:   You have severe or persistent:  Headache.  Ear pain.  Sinus pain.  Chest pain.  You have chronic lung disease and any of the following:  Wheezing.  Prolonged cough.  Coughing up blood.  A change in your usual mucus.  You have a stiff neck.  You have changes in your:  Vision.  Hearing.  Thinking.  Mood. MAKE SURE YOU:   Understand these instructions.  Will watch your condition.  Will get help right away if you are not doing well or get worse.   This information is not intended to replace advice given to you by your health care provider. Make sure you discuss any questions you have with your health care provider.  Take antibiotics as prescribed. Follow up with your primary care provider for re-evaluation. Encourage adequate hydration. Return to the emergency department if you experience severe worsening of her symptoms, shortness of breath, fever, chest pain.

## 2015-02-08 NOTE — ED Notes (Signed)
Cough cold for 3 momnths p[roductive cough  White.  Unknown temp

## 2015-02-08 NOTE — ED Provider Notes (Signed)
CSN: [id number]     Arrival date & time 02/08/15  2151 History  By signing my name below, I, [facility], attest that this documentation has been prepared under the direction and in the presence of [employer], PA-C. Electronically Signed: Randell Stark, ED Scribe. 02/08/2015. 11:22 PM.   Chief Complaint  Stark presents with  . Cough   The history is provided by the Stark. No language interpreter was used.    HPI Comments: Frank Stark is a 35 y.o. male with a hx of HIV who presents to the Emergency Department complaining of an intermittent, unchanging productive cough onset 2 months ago. Stark endorses associated sore throat, subjective fever, and intermittent diarrhea. He has taken Tylenol Cold Max, Alka Seltzer Cold, and Robitussin without relief. He denies any hx of chronic conditions. Stark denies ear pain, vomiting, headache.  Past Medical History  Diagnosis Date  . HIV (human immunodeficiency virus infection) (HCC)    History reviewed. No pertinent past surgical history. Family History  Problem Relation Age of Onset  . Hypertension Mother   . Hypertension Father   . Asthma Sister   . Diabetes Maternal Grandmother   . Dementia Maternal Grandmother   . Glaucoma Maternal Grandfather    Social History  Substance Use Topics  . Smoking status: Current Every Day Smoker -- 0.00 packs/day for 0 years    Types: Cigarettes  . Smokeless tobacco: Never Used  . Alcohol Use: Yes    Review of Systems  Constitutional: Positive for fever (Subjective).  HENT: Positive for sore throat. Negative for ear pain.   Respiratory: Positive for cough (Nonproductive).   Gastrointestinal: Positive for diarrhea. Negative for vomiting.  All other systems reviewed and are negative.     Allergies  Review of Stark's allergies indicates no known allergies.  Home Medications   Prior to Admission medications   Medication Sig Start Date End Date Taking? Authorizing  Provider  Abacavir-Dolutegravir-Lamivud 600-50-300 MG TABS Take 1 tablet by mouth daily. Stark not taking: Reported on 02/08/2015 02/18/14   Gardiner Barefoot, MD  azithromycin (ZITHROMAX) 250 MG tablet Take 1 tablet (250 mg total) by mouth daily. Take first 2 tablets together, then 1 every day until finished. 02/08/15   Frank Navarrette Tripp Nyelle Wolfson, PA-C  benzonatate (TESSALON PERLES) 100 MG capsule Take 1 capsule (100 mg total) by mouth 3 (three) times daily as needed for cough. 02/08/15   Frank Tetzloff Tripp Danai Gotto, PA-C   BP 119/63 mmHg  Pulse 88  Temp(Src) 99.6 F (37.6 C)  Resp 16  Ht 5\' 11"  (1.803 m)  Wt 148 lb 5 oz (67.274 kg)  BMI 20.69 kg/m2  SpO2 98% Physical Exam  Constitutional: He is oriented to person, place, and time. He appears well-developed and well-nourished. No distress.  HENT:  Head: Normocephalic and atraumatic.  Mouth/Throat: Uvula is midline, oropharynx is clear and moist and mucous membranes are normal. No trismus in the jaw. No oropharyngeal exudate, posterior oropharyngeal edema, posterior oropharyngeal erythema or tonsillar abscesses.  Eyes: Conjunctivae are normal. Right eye exhibits no discharge. Left eye exhibits no discharge. No scleral icterus.  Neck: Neck supple.  Cardiovascular: Normal rate, normal heart sounds and intact distal pulses.   No murmur heard. Pulmonary/Chest: Effort normal and breath sounds normal. No respiratory distress. He has no wheezes. He has no rales. He exhibits no tenderness.  Abdominal: Soft. He exhibits no distension and no mass. There is no tenderness. There is no rebound and no guarding.  Lymphadenopathy:  He has no cervical adenopathy.  Neurological: He is alert and oriented to person, place, and time. Coordination normal.  Skin: Skin is warm and dry. No rash noted. He is not diaphoretic. No erythema. No pallor.  Psychiatric: He has a normal mood and affect. His behavior is normal.  Nursing note and vitals reviewed.   ED Course   Procedures   DIAGNOSTIC STUDIES: Oxygen Saturation is 97% on RA, normal by my interpretation.    COORDINATION OF CARE: 10:27 PM Will order chest x-ray. Discussed treatment plan with pt at bedside and pt agreed to plan.  Imaging Review Dg Chest 2 View  02/08/2015  CLINICAL DATA:  Cough for 2 months. EXAM: CHEST  2 VIEW COMPARISON:  12/10/2011 FINDINGS: Progressive bronchial thickening from prior exam. No confluent airspace disease. Cardiomediastinal contours are normal. No pleural effusion or pneumothorax. No osseous abnormalities. IMPRESSION: Progressive bronchitic change from prior exam. Electronically Signed   By: Rubye OaksMelanie  Ehinger M.D.   On: 02/08/2015 23:06   I have personally reviewed and evaluated these images as part of my medical decision-making.   MDM   Final diagnoses:  Bronchitis    Chest x-ray negative for pneumonia or consolidation. However shows increased bronchitic change. Pt afebrile. In NAD. Given duration of symptoms will give Stark azithromycin to cover atypicals. Stark also given Tessalon for cough. Pt will follow up with PCP. Return precautions outlined in Stark discharge instructions.   I personally performed the services described in this documentation, which was scribed in my presence. The recorded information has been reviewed and is accurate.     Frank KinsmanSamantha Tripp [place], PA-C 02/11/15 1726  Mancel BaleElliott Wentz, MD 02/12/15 [phone]

## 2015-02-08 NOTE — ED Notes (Signed)
Pt. Left with all belongings and refused wheelchair. Discharge instructions were reviewed and all questions were answered.  

## 2016-02-07 ENCOUNTER — Emergency Department (HOSPITAL_COMMUNITY): Payer: Self-pay

## 2016-02-07 ENCOUNTER — Emergency Department (HOSPITAL_COMMUNITY)
Admission: EM | Admit: 2016-02-07 | Discharge: 2016-02-07 | Disposition: A | Discharge status: Home / Self Care | Payer: Self-pay | Attending: Emergency Medicine | Admitting: Emergency Medicine

## 2016-02-07 ENCOUNTER — Encounter (HOSPITAL_COMMUNITY): Payer: Self-pay

## 2016-02-07 DIAGNOSIS — R1032 Left lower quadrant pain: Secondary | ICD-10-CM | POA: Insufficient documentation

## 2016-02-07 DIAGNOSIS — F17210 Nicotine dependence, cigarettes, uncomplicated: Secondary | ICD-10-CM | POA: Insufficient documentation

## 2016-02-07 DIAGNOSIS — Z79899 Other long term (current) drug therapy: Secondary | ICD-10-CM | POA: Insufficient documentation

## 2016-02-07 LAB — COMPREHENSIVE METABOLIC PANEL
ALT: 34 U/L (ref 17–63)
ANION GAP: 6 (ref 5–15)
AST: 40 U/L (ref 15–41)
Albumin: 3.7 g/dL (ref 3.5–5.0)
Alkaline Phosphatase: 91 U/L (ref 38–126)
BUN: 14 mg/dL (ref 6–20)
CO2: 27 mmol/L (ref 22–32)
Calcium: 9 mg/dL (ref 8.9–10.3)
Chloride: 100 mmol/L — ABNORMAL LOW (ref 101–111)
Creatinine, Ser: 1 mg/dL (ref 0.61–1.24)
GFR calc Af Amer: >60 mL/min (ref >60)
GFR calc non Af Amer: >60 mL/min (ref >60)
Glucose, Bld: 104 mg/dL — ABNORMAL HIGH (ref 65–99)
POTASSIUM: 3.9 mmol/L (ref 3.5–5.1)
SODIUM: 133 mmol/L — AB (ref 135–145)
Total Bilirubin: 0.4 mg/dL (ref 0.3–1.2)
Total Protein: 9.1 g/dL — ABNORMAL HIGH (ref 6.5–8.1)

## 2016-02-07 LAB — CBC
HCT: 42.8 % (ref 39.0–52.0)
HEMOGLOBIN: 14.3 g/dL (ref 13.0–17.0)
MCH: 32.1 pg (ref 26.0–34.0)
MCHC: 33.4 g/dL (ref 30.0–36.0)
MCV: 96.2 fL (ref 78.0–100.0)
PLATELETS: 80 10*3/uL — AB (ref 150–400)
RBC: 4.45 MIL/uL (ref 4.22–5.81)
RDW: 13.3 % (ref 11.5–15.5)
WBC: 4.2 10*3/uL (ref 4.0–10.5)

## 2016-02-07 LAB — URINALYSIS, ROUTINE W REFLEX MICROSCOPIC
BILIRUBIN URINE: NEGATIVE
Glucose, UA: NEGATIVE mg/dL
Hgb urine dipstick: NEGATIVE
KETONES UR: NEGATIVE mg/dL
Nitrite: NEGATIVE
PH: 5 (ref 5.0–8.0)
Protein, ur: 30 mg/dL — AB
Specific Gravity, Urine: 1.027 (ref 1.005–1.030)

## 2016-02-07 LAB — LIPASE, BLOOD: LIPASE: 16 U/L (ref 11–51)

## 2016-02-07 MED ORDER — SODIUM CHLORIDE 0.9 % IV BOLUS (SEPSIS)
1000.0000 mL | Freq: Once | INTRAVENOUS | Status: AC
  Administered 2016-02-07: 1000 mL via INTRAVENOUS

## 2016-02-07 MED ORDER — IOPAMIDOL (ISOVUE-300) INJECTION 61%
INTRAVENOUS | Status: AC
  Administered 2016-02-07: 100 mL
  Filled 2016-02-07: qty 100

## 2016-02-07 NOTE — ED Notes (Signed)
C/o abd pain with diarrhea onset yest.

## 2016-02-07 NOTE — ED Notes (Signed)
Patient transported to CT 

## 2016-02-07 NOTE — ED Notes (Signed)
Pt asked to submit a urine sample. Pt stated that he is "unable to go".

## 2016-02-07 NOTE — ED Notes (Signed)
Pt verbalized understanding discharge instructions and denies any further needs or questions at this time. VS stable, ambulatory and steady gait.   

## 2016-02-07 NOTE — ED Provider Notes (Signed)
[facility]-EMERGENCY DEPT Provider Note   CSN: [id number] Arrival date & time: 02/07/16  7829  By signing my name below, I, Javier Docker, attest that this documentation has been prepared under the direction and in the presence of Barbie Banner, MD. Electronically Signed: Javier Docker, ER Scribe. 09/17/2015. 12:57 PM.   History   Chief Complaint Chief Complaint  Patient presents with  . Abdominal Pain   The history is provided by the patient. No language interpreter was used.  Abdominal Pain   Associated symptoms include diarrhea. Pertinent negatives include fever.    HPI Comments: Frank Stark is a 36 y.o. male who presents to the Emergency Department complaining of LLQ abdominal pain with associated diarrhea and chills. He states the pain feels like a tightening, cramping sensation. He states he usually wakes up in the morning with abdominal cramping normally. He has a family hx of diverticulitis. He denies blood in stools, dysuria, vomiting.    Past Medical History:  Diagnosis Date  . HIV (human immunodeficiency virus infection) [facility])     Patient Active Problem List   Diagnosis Date Noted  . Gonorrhea contact 07/21/2013  . Enuresis 07/21/2013  . Sciatica 07/09/2012  . Human immunodeficiency virus (HIV) disease (HCC) 02/27/2012  . Inguinal hernia, left 02/27/2012  . History of syphilis 02/27/2012  . Dental caries 02/27/2012  . 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 Papanicolaou smear of anus with atypical squamous cells of undetermined significance (ASC-US) 02/27/2012    History reviewed. No pertinent surgical history.     Home Medications    Prior to Admission medications   Medication Sig Start Date End Date Taking? Authorizing Provider  Abacavir-Dolutegravir-Lamivud 600-50-300 MG TABS Take 1 tablet by mouth daily. Patient not taking: Reported on 02/08/2015 02/18/14   Gardiner Barefoot, MD  azithromycin (ZITHROMAX) 250 MG tablet Take 1 tablet (250 mg  total) by mouth daily. Take first 2 tablets together, then 1 every day until finished. 02/08/15   Samantha Tripp Dowless, PA-C  benzonatate (TESSALON PERLES) 100 MG capsule Take 1 capsule (100 mg total) by mouth 3 (three) times daily as needed for cough. 02/08/15   Samantha Tripp Dowless, PA-C    Family History Family History  Problem Relation Age of Onset  . Hypertension Mother   . Hypertension Father   . Asthma Sister   . Diabetes Maternal Grandmother   . Dementia Maternal Grandmother   . Glaucoma Maternal Grandfather     Social History Social History  Substance Use Topics  . Smoking status: Current Every Day Smoker    Packs/day: 0.00    Years: 0.00    Types: Cigarettes  . Smokeless tobacco: Never Used  . Alcohol use Yes     Allergies   Patient has no known allergies.   Review of Systems Review of Systems  Constitutional: Negative for chills and fever.  Gastrointestinal: Positive for abdominal pain and diarrhea.  All other systems reviewed and are negative.   Physical Exam Updated Vital Signs BP 118/68 (BP Location: Right Arm)   Pulse 79   Temp 98.1 F (36.7 C) (Oral)   Resp 20   Ht 5\' 10"  (1.778 m)   Wt 150 lb (68 kg)   SpO2 98%   BMI 21.52 kg/m   Physical Exam  Constitutional: He is oriented to person, place, and time. He appears well-developed and well-nourished.  HENT:  Head: Normocephalic and atraumatic.  Eyes: EOM are normal.  Neck: Normal range of motion.  Cardiovascular:  Normal rate, regular rhythm, normal heart sounds and intact distal pulses.   Pulmonary/Chest: Effort normal and breath sounds normal. No respiratory distress.  Abdominal: Soft. He exhibits no distension. There is no tenderness.  TTP in the LLQ. No rebound or guarding.   Musculoskeletal: Normal range of motion.  Neurological: He is alert and oriented to person, place, and time.  Skin: Skin is warm and dry.  Psychiatric: He has a normal mood and affect. Judgment normal.  Nursing  note and vitals reviewed.   ED Treatments / Results  DIAGNOSTIC STUDIES: Oxygen Saturation is 98% on RA, normal by my interpretation.    COORDINATION OF CARE: 1:02 PM Discussed treatment plan with pt at bedside and pt agreed to plan.  Labs (all labs ordered are listed, but only abnormal results are displayed) Labs Reviewed  COMPREHENSIVE METABOLIC PANEL - Abnormal; Notable for the following:       Result Value   Sodium 133 (*)    Chloride 100 (*)    Glucose, Bld 104 (*)    Total Protein 9.1 (*)    All other components within normal limits  CBC - Abnormal; Notable for the following:    Platelets 80 (*)    All other components within normal limits  LIPASE, BLOOD  URINALYSIS, ROUTINE W REFLEX MICROSCOPIC    EKG  EKG Interpretation None       Radiology No results found.  Procedures Procedures (including critical care time)  Medications Ordered in ED Medications - No data to display   Initial Impression / Assessment and Plan / ED Course  I have reviewed the triage vital signs and the nursing notes.  Pertinent labs & imaging results that were available during my care of the patient were reviewed by me and considered in my medical decision making (see chart for details).  Clinical Course     Patient presents with a several day history of left lower quadrant pain and loose stools. His family members have had diverticulitis and he is concerned he may have the same. He is mildly tender in the left lower quadrant, however there is no fever or white count. CT scan fails to reveal diverticulitis. I suspect some sort of viral etiology. This will be treated with over-the-counter pain medication and when necessary return should his symptoms worsen or change.  Final Clinical Impressions(s) / ED Diagnoses   Final diagnoses:  None    New Prescriptions New Prescriptions   No medications on file    I personally performed the services described in this documentation, which  was scribed in my presence. The recorded information has been reviewed and is accurate.           Geoffery Lyonsouglas Vanilla Heatherington, MD 02/07/16 2031

## 2016-02-07 NOTE — Discharge Instructions (Signed)
Ibuprofen 600 mg every 6 hours as needed for pain.   Return to the emergency department if you develop severe abdominal pain, high fevers, bloody stools, or other new and concerning symptoms.

## 2016-02-07 NOTE — ED Triage Notes (Signed)
Pt presents for L lower abd pain x 3 days with nausea and diarrhea. Pt reports family hx of diverticulosis but no personal hx. Pt AxO x4. Denies vomiting/CP/SOB

## 2019-12-31 ENCOUNTER — Emergency Department (HOSPITAL_COMMUNITY): Payer: Self-pay

## 2019-12-31 ENCOUNTER — Encounter (HOSPITAL_COMMUNITY): Payer: Self-pay

## 2019-12-31 ENCOUNTER — Emergency Department (HOSPITAL_COMMUNITY)
Admission: EM | Admit: 2019-12-31 | Discharge: 2019-12-31 | Disposition: A | Discharge status: Home / Self Care | Payer: Self-pay | Attending: Emergency Medicine | Admitting: Emergency Medicine

## 2019-12-31 DIAGNOSIS — Z21 Asymptomatic human immunodeficiency virus [HIV] infection status: Secondary | ICD-10-CM | POA: Insufficient documentation

## 2019-12-31 DIAGNOSIS — S8254XA Nondisplaced fracture of medial malleolus of right tibia, initial encounter for closed fracture: Secondary | ICD-10-CM | POA: Insufficient documentation

## 2019-12-31 DIAGNOSIS — F17210 Nicotine dependence, cigarettes, uncomplicated: Secondary | ICD-10-CM | POA: Insufficient documentation

## 2019-12-31 DIAGNOSIS — S82891A Other fracture of right lower leg, initial encounter for closed fracture: Secondary | ICD-10-CM

## 2019-12-31 MED ORDER — HYDROCODONE-ACETAMINOPHEN 5-325 MG PO TABS
1.0000 | ORAL_TABLET | Freq: Once | ORAL | Status: AC
  Administered 2019-12-31: 1 via ORAL
  Filled 2019-12-31: qty 1

## 2019-12-31 MED ORDER — FENTANYL CITRATE (PF) 100 MCG/2ML IJ SOLN
25.0000 ug | Freq: Once | INTRAMUSCULAR | Status: AC
  Administered 2019-12-31: 25 ug via INTRAVENOUS
  Filled 2019-12-31: qty 2

## 2019-12-31 NOTE — ED Notes (Signed)
Pt ambulating w/o assistance using 3 point gait with crutches.

## 2019-12-31 NOTE — Discharge Instructions (Signed)
You may alternate taking Tylenol and Naproxen as needed for pain control. You may take Naproxen twice daily as directed on your discharge paperwork and you may take  500-1000 mg of Tylenol every 6 hours. Do not exceed 4000 mg of Tylenol daily as this can lead to liver damage. Also, make sure to take Naproxen with meals as it can cause an upset stomach. Do not take other NSAIDs while taking Naproxen such as (Aleve, Ibuprofen, Aspirin, Celebrex, etc) and do not take more than the prescribed dose as this can lead to ulcers and bleeding in your GI tract. You may use warm and cold compresses to help with your symptoms.   Please follow up with the orthopedic doctor within the next 7-10 days for re-evaluation and further treatment of your symptoms.   Please return to the ER sooner if you have any new or worsening symptoms.  

## 2019-12-31 NOTE — Progress Notes (Signed)
Orthopedic Tech Progress Note Patient Details:  Frank Stark 01-01-1981 [id number]  Ortho Devices Type of Ortho Device: Stirrup splint, Short leg splint Ortho Device/Splint Location: RLE Ortho Device/Splint Interventions: Application, Ordered   Post Interventions Patient Tolerated: Well   [name] A Burnis Kaser 12/31/2019, 10:31 AM

## 2019-12-31 NOTE — ED Notes (Signed)
Pt to ED 28. Reporting right ankle pain. DP Pulse strong RLE
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
## 2019-12-31 NOTE — ED Notes (Signed)
Pt resting in wheelchair. Pain improved. Awaiting [employer] for splint placement

## 2020-04-17 ENCOUNTER — Encounter (HOSPITAL_COMMUNITY): Payer: Self-pay

## 2020-04-17 ENCOUNTER — Emergency Department (HOSPITAL_COMMUNITY)
Admission: EM | Admit: 2020-04-17 | Discharge: 2020-04-17 | Disposition: A | Discharge status: Home / Self Care | Payer: Self-pay | Attending: Emergency Medicine | Admitting: Emergency Medicine

## 2020-04-17 ENCOUNTER — Other Ambulatory Visit: Payer: Self-pay

## 2020-04-17 DIAGNOSIS — Z139 Encounter for screening, unspecified: Secondary | ICD-10-CM

## 2020-04-17 DIAGNOSIS — Z0000 Encounter for general adult medical examination without abnormal findings: Secondary | ICD-10-CM | POA: Insufficient documentation

## 2020-04-17 DIAGNOSIS — Z21 Asymptomatic human immunodeficiency virus [HIV] infection status: Secondary | ICD-10-CM | POA: Insufficient documentation

## 2020-04-17 DIAGNOSIS — F17210 Nicotine dependence, cigarettes, uncomplicated: Secondary | ICD-10-CM | POA: Insufficient documentation

## 2020-04-17 NOTE — Discharge Instructions (Signed)
Substance Abuse Treatment Programs ° °Intensive Outpatient Programs °[facility]     °[address]      °[place], [place]                   °[phone]      ° °[facility] °[address] °[place], [place] °336-379-7146 ° °[facility]     °(Inpatient and outpatient)     °[address]           °[phone]   ° °[facility] °336-288-1484 (Suboxone and Methadone) ° °[address]      °[place], [place] [address]      °336-882-2125      ° °[address] °[place], [place] °852-3033 ° °[facility] (Outpatient/Inpatient, [facility])    °(insurance only) [phone]      °       °[facility] (Groups & Residential) °[place], [place] °336-389-1413 ° °   °[facility]     °[address]     °[place], [place]      °[phone]      ° °[facility] (for caregivers and family) °[address] °[place], [place] °336-299-4655 ° ° ° ° ° °Residential Treatment Programs °[facility]      °[address], [place], [place] [address]  °([phone]      ° °[facility] °[address] [place], [place] [address] °[phone] ° °[facility]        °336-248-8914      ° °[facility] °1-800-659-3381 ° °[facility]             °[address]                                         °[place], [place]                                                °877-615-2722 or 336-784-9470                              ° °[facility] [place] °[address] °[place], [address] °1.877.941.8954 ° °[facility]    °[address]      °[place], [place]     °336-273-5306      ° °[facility] °[address] °[place], [place] °336-285-9073 ° °[facility]   °[address]     °[place], [place] [address]     °336-899-1550      °Admissions: 8am-3pm M-F ° °[facility] °[address] °[place] °336-227-7417 ° °[facility]: Residential Program (90 Days)   °[place], [place]      °336-725-8389 or 800-758-6077    ° °[facility] °[place], [place] °(Walk in Hours over the weekend or by referral) ° °[facility] °[address], [place],  [address] °(336) [phone] ° °Crisis Mobile: [facility]:  1-877-626-1772 (for crisis response 24 hours a day) °[facility] Hotline:      1-800-256-2452 °Outpatient Psychiatry and Counseling ° °[facility]: Mobile Crisis   Management 24 hours:  1-877-626-1772 ° °[facility] sliding scale fee and walk in schedule: M-F 8am-12pm/1pm-3pm °[address]  °[place], [place] [address] °[phone] ° °[facility] °[address] °[place], [place] [address] °336-703-9650 ° °[facility] ([facility] known [facility])- new patient walk-in appointments available Monday - Friday 8am -3pm.          °[address] °[place], [place] [address] °336-676-6840 or crisis line- 336-676-6905 ° °[facility]/ Intensive Outpatient Therapy Program °[address] °[place], [place] [address] °336-832-9804 ° °[facility]                  °Crisis Services      °336.641.4993      °[address]     °[place], [place] [address]                ° °[facility]   °[facility] °[phone] °[address] °[place], [place] [address] ° ° °[facility]          °[address]  °[place], [place] [address]       °(336) [phone] ° °[facility] °[address] °[place], [place] [address] °336-292-1510 ° °[facility]    °[address]    °[place], [place] [address]     °336-632-3505      ° °Parish McKinney, MD     °[address]     °[place] [address]     °336-282-1251     °  °[facility] °[address] °[place] [address] ° °[facility]     °[address]     °[place], [place]      °336-542-2076      ° °[facility] °Shamsher Ahluwalia, MD °[address] °[place], [place] [address] °336-420-9558 ° °[facility]     °[address]     °[place], [place] [address]     °336-274-1237      ° °[facility] °[address] °[place], [place] [address] °336-854-4450 °Resources for Temporary Residential Assistance/Crisis Centers ° °DAY CENTERS °[facility] °M-F 8am-3pm   °[address] [place], [place] [address]   336-332-0824 °Services include: laundry, barbering, support groups, case management, phone  & computer access, showers, AA/NA mtgs, mental health/substance abuse nurse, job skills class, disability information, VA assistance, spiritual classes, etc.  ° °HOMELESS SHELTERS ° °[facility]     °[facility]   °[address], [place]     °336.271.5959       °       °[facility] (women and children)       °[address] °[place], [place] [address] °336-275-0820 °[email or web address] for application and process °Application Required ° °[facility]   °[address]    °[place]  [address]     °[phone]       °             °[facility] °[address] °[place],  [address] °336.273.5572 °336-235-0363(schedule application appt.) °Application Required ° °[facility] (women only)    °[address]     °[place],  [address]     °336-884-1039      °  Intake starts 6pm daily °Need valid ID, SSC, & Police report °[facility] [place] °[address] °[place] °336-881-5420 °Application Required ° °[facility] (men only)     °[address]      °[place], [place]     °[phone]      ° °[facility] °(Pregnant women only) °[address] °[place], [place] °336-275-0206 ° °[facility]      °[address]      °[place], [place] [address]     °336-722-9951      °       °[facility] [place] [facility] °[address] °[place], [place] °336-723-1848 °90 day commitment/SA/Application process ° °[facility](men only)     °[address]     °[place], [place]     °[phone]       °Check-in at 7pm     °       °[facility] °[address] °[place], [place] [address] °336-248-6684 °Men/Women/Women and Children must be there by 7 pm ° °[facility] °[place], [place] °336-722-8721                ° °

## 2020-04-17 NOTE — ED Provider Notes (Signed)
Emergency Department Provider Note   I have reviewed the triage vital signs and the nursing notes.   HISTORY  Chief Complaint Medication Refill   HPI Frank Stark is a 40 y.o. male with past medical history of HIV, currently homeless, presents to the emergency department by EMS with initial complaint of requesting a medication refill.  According to EMS the patient was loitering outside a gas station and staff there called for assistance.  He denies any pain.  He states he has been very cold with night temps here of around 56 F.  Denies any chest pain, shortness of breath, abdominal pain.  He was drinking alcohol earlier in the evening but denies anything in the last several hours.  Denies drug use.  Denies any assault or head trauma.  He tells me that actually has medication at home for HIV, Biktarvy, and can get additional refills from his PCP.   Past Medical History:  Diagnosis Date  . HIV (human immunodeficiency virus infection) [facility])     Patient Active Problem List   Diagnosis Date Noted  . Gonorrhea contact 07/21/2013  . Enuresis 07/21/2013  . Sciatica 07/09/2012  . Human immunodeficiency virus (HIV) disease (HCC) 02/27/2012  . Inguinal hernia, left 02/27/2012  . History of syphilis 02/27/2012  . Dental caries 02/27/2012  . 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 Papanicolaou smear of anus with atypical squamous cells of undetermined significance (ASC-US) 02/27/2012    History reviewed. No pertinent surgical history.  Allergies Levofloxacin  Family History  Problem Relation Age of Onset  . Hypertension Mother   . Hypertension Father   . Asthma Sister   . Diabetes Maternal Grandmother   . Dementia Maternal Grandmother   . Glaucoma Maternal Grandfather     Social History Social History   Tobacco Use  . Smoking status: Current Every Day Smoker    Packs/day: 0.00    Years: 0.00    Pack years: 0.00    Types: Cigarettes  . Smokeless tobacco:  Never Used  Substance Use Topics  . Alcohol use: Yes  . Drug use: Yes    Types: Marijuana    Comment: occasional    Review of Systems  Constitutional: No fever/chills Eyes: No visual changes. ENT: No sore throat. Cardiovascular: Denies chest pain. Respiratory: Denies shortness of breath. Gastrointestinal: No abdominal pain.  No nausea, no vomiting.  No diarrhea.  No constipation. Genitourinary: Negative for dysuria. Musculoskeletal: Negative for back pain. Skin: Negative for rash. Neurological: Negative for headaches, focal weakness or numbness.  10-point ROS otherwise negative.  ____________________________________________   PHYSICAL EXAM:  VITAL SIGNS: ED Triage Vitals  Enc Vitals Group     BP 04/17/20 0721 (!) 148/88     Pulse Rate 04/17/20 0721 88     Resp 04/17/20 0721 18     Temp 04/17/20 0721 (!) 97.5 F (36.4 C)     Temp Source 04/17/20 0721 Oral     SpO2 04/17/20 0721 98 %   Constitutional: Sleeping but awakens easily to voice and provides an appropriate history.  Eyes: Conjunctivae are normal. Head: Atraumatic. Nose: No congestion/rhinnorhea. Mouth/Throat: Mucous membranes are moist.  Neck: No stridor.   Cardiovascular: Normal rate, regular rhythm. Good peripheral circulation. Grossly normal heart sounds.   Respiratory: Normal respiratory effort.  No retractions. Lungs CTAB. Gastrointestinal: Soft and nontender. No distention.  Musculoskeletal: CAM walker boot on the right. No deformity noted to upper/lower extremities. Moving extremities equally.  Neurologic:  Normal speech and  language. No gross focal neurologic deficits are appreciated.  Skin:  Skin is warm, dry and intact. No rash noted.  ____________________________________________   PROCEDURES  Procedure(s) performed:   Procedures  None  ____________________________________________   INITIAL IMPRESSION / ASSESSMENT AND PLAN / ED COURSE  Pertinent labs & imaging results that were  available during my care of the patient were reviewed by me and considered in my medical decision making (see chart for details).   Patient presents to the emergency department after being loitering outside a gas station earlier this morning and very cold temperatures.  He does not have any physical complaints just was feeling cold.  His temp on arrival is 97.5 F.  Vital signs are otherwise unremarkable other than mild hypertension.  He does have medication left at home.  I offered to supply a refill of his Biktarvy if needed until he can speak with PCP but he declines. Will ambulate and PO challenge.   08:55 AM  Patient remains clinically sober. He is awake and alert. Again denies needing Rx refill. Will discharge with list of community resources.  ____________________________________________  FINAL CLINICAL IMPRESSION(S) / ED DIAGNOSES  Final diagnoses:  Encounter for medical screening examination    Note:  This document was prepared using Dragon voice recognition software and may include unintentional dictation errors.  Alona Bene, MD, [facility] Emergency Medicine    Ramonia Mcclaran, Arlyss Repress, MD 04/17/20 [phone]

## 2020-04-17 NOTE — ED Triage Notes (Signed)
EMS reports from gas station, homeless, PD called out due to loitering, Pt admits to ETOH ingestion, states he wants to get checked out because he is out of and has not been taking his HIV meds.  BP 160/90 HR 90 RR 16 Sp02 95 RA CBG 112

## 2020-06-21 ENCOUNTER — Encounter (HOSPITAL_COMMUNITY): Payer: Self-pay

## 2020-06-21 ENCOUNTER — Emergency Department (HOSPITAL_COMMUNITY): Payer: Self-pay

## 2020-06-21 ENCOUNTER — Emergency Department (HOSPITAL_COMMUNITY)
Admission: EM | Admit: 2020-06-21 | Discharge: 2020-06-21 | Disposition: A | Discharge status: Home / Self Care | Payer: Self-pay | Attending: Emergency Medicine | Admitting: Emergency Medicine

## 2020-06-21 DIAGNOSIS — H60502 Unspecified acute noninfective otitis externa, left ear: Secondary | ICD-10-CM | POA: Insufficient documentation

## 2020-06-21 DIAGNOSIS — Z21 Asymptomatic human immunodeficiency virus [HIV] infection status: Secondary | ICD-10-CM | POA: Insufficient documentation

## 2020-06-21 DIAGNOSIS — R0781 Pleurodynia: Secondary | ICD-10-CM | POA: Insufficient documentation

## 2020-06-21 DIAGNOSIS — F17210 Nicotine dependence, cigarettes, uncomplicated: Secondary | ICD-10-CM | POA: Insufficient documentation

## 2020-06-21 LAB — BASIC METABOLIC PANEL
Anion gap: 5 (ref 5–15)
BUN: 17 mg/dL (ref 6–20)
CO2: 27 mmol/L (ref 22–32)
Calcium: 8.7 mg/dL — ABNORMAL LOW (ref 8.9–10.3)
Chloride: 104 mmol/L (ref 98–111)
Creatinine, Ser: 1.13 mg/dL (ref 0.61–1.24)
GFR, Estimated: >60 mL/min (ref >60)
Glucose, Bld: 85 mg/dL (ref 70–99)
Potassium: 3.5 mmol/L (ref 3.5–5.1)
Sodium: 136 mmol/L (ref 135–145)

## 2020-06-21 LAB — CBC
HCT: 34.8 % — ABNORMAL LOW (ref 39.0–52.0)
Hemoglobin: 12 g/dL — ABNORMAL LOW (ref 13.0–17.0)
MCH: 33.7 pg (ref 26.0–34.0)
MCHC: 34.5 g/dL (ref 30.0–36.0)
MCV: 97.8 fL (ref 80.0–100.0)
Platelets: 149 10*3/uL — ABNORMAL LOW (ref 150–400)
RBC: 3.56 MIL/uL — ABNORMAL LOW (ref 4.22–5.81)
RDW: 12.5 % (ref 11.5–15.5)
WBC: 4.1 10*3/uL (ref 4.0–10.5)
nRBC: 0 % (ref 0.0–0.2)

## 2020-06-21 LAB — TROPONIN I (HIGH SENSITIVITY)
Troponin I (High Sensitivity): 2 ng/L (ref <18)
Troponin I (High Sensitivity): 3 ng/L (ref <18)

## 2020-06-21 LAB — D-DIMER, QUANTITATIVE: D-Dimer, Quant: <0.27 ug/mL-FEU (ref 0.00–0.50)

## 2020-06-21 MED ORDER — KETOROLAC TROMETHAMINE 30 MG/ML IJ SOLN
30.0000 mg | Freq: Once | INTRAMUSCULAR | Status: AC
  Administered 2020-06-21: 30 mg via INTRAMUSCULAR
  Filled 2020-06-21: qty 1

## 2020-06-21 MED ORDER — NEOMYCIN-POLYMYXIN-HC 3.5-10000-1 OT SUSP: 4.0000 [drp] | Freq: Three times a day (TID) | OTIC | 0 refills | Status: AC

## 2020-06-21 MED ORDER — ACETAMINOPHEN 500 MG PO TABS
500.0000 mg | ORAL_TABLET | Freq: Once | ORAL | Status: AC
  Administered 2020-06-21: 500 mg via ORAL
  Filled 2020-06-21: qty 1

## 2020-06-21 NOTE — ED Provider Notes (Signed)
[facility] EMERGENCY DEPARTMENT Provider Note   CSN: [id number] Arrival date & time: 06/21/20  0251     History Chief Complaint  Patient presents with  . Chest Pain    CHRISOPHER PUSTEJOVSKY is a 40 y.o. male with past medical history of homelessness, HIV followed by [facility] that presents to the emergency department today for chest pain.  Patient states that chest pain started last night, states that its sharp and pleuritic.  It is intermittent, not constant.  Denies any shortness of breath, does not radiate.  Denies any neck pain, arm pain, jaw pain.  Denies any shortness breath, nausea, vomiting, abdominal pain, back pain.  Patient states that it started gradually while sitting in his car last night.  Does admit to marijuana, alcohol and tobacco use.  Per chart review patient has had this chest pain before, patient states that it feels similar to when he had it last.  Was seen in June of last year with pulmonology at [facility].Patient states that his been compliant with his antivirals, last HIV RNA quantitation undetected in September 2021.  Patient regularly follows up with ID.  HPI     Past Medical History:  Diagnosis Date  . HIV (human immunodeficiency virus infection) [facility])     Patient Active Problem List   Diagnosis Date Noted  . Gonorrhea contact 07/21/2013  . Enuresis 07/21/2013  . Sciatica 07/09/2012  . Human immunodeficiency virus (HIV) disease (HCC) 02/27/2012  . Inguinal hernia, left 02/27/2012  . History of syphilis 02/27/2012  . Dental caries 02/27/2012  . 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 Papanicolaou smear of anus with atypical squamous cells of undetermined significance (ASC-US) 02/27/2012    History reviewed. No pertinent surgical history.     Family History  Problem Relation Age of Onset  . Hypertension Mother   . Hypertension Father   . Asthma Sister   . Diabetes Maternal Grandmother   . Dementia  Maternal Grandmother   . Glaucoma Maternal Grandfather     Social History   Tobacco Use  . Smoking status: Current Every Day Smoker    Packs/day: 0.00    Years: 0.00    Pack years: 0.00    Types: Cigarettes  . Smokeless tobacco: Never Used  Substance Use Topics  . Alcohol use: Yes  . Drug use: Yes    Types: Marijuana    Comment: occasional    Home Medications Prior to Admission medications   Medication Sig Start Date End Date Taking? Authorizing Provider  BIKTARVY 50-200-25 MG TABS tablet Take 1 tablet by mouth daily. 11/25/19  Yes [provider]  neomycin-polymyxin-hydrocortisone (CORTISPORIN) 3.5-10000-1 OTIC suspension Place 4 drops into the left ear 3 (three) times daily for 7 days. 06/21/20 06/28/20 Yes Jerrod Damiano, PA-C  Abacavir-Dolutegravir-Lamivud 600-50-300 MG TABS Take 1 tablet by mouth daily. Patient not taking: Reported on 12/31/2019 02/18/14   Gardiner Barefoot, MD  benzonatate (TESSALON PERLES) 100 MG capsule Take 1 capsule (100 mg total) by mouth 3 (three) times daily as needed for cough. Patient not taking: Reported on 12/31/2019 02/08/15   Dowless, Lester Kinsman, PA-C    Allergies    Levofloxacin  Review of Systems   Review of Systems  Constitutional: Negative for chills, diaphoresis, fatigue and fever.  HENT: Negative for congestion, sore throat and trouble swallowing.   Eyes: Negative for pain and visual disturbance.  Respiratory: Negative for cough, shortness of breath and wheezing.   Cardiovascular: Positive for  chest pain. Negative for palpitations and leg swelling.  Gastrointestinal: Negative for abdominal distention, abdominal pain, diarrhea, nausea and vomiting.  Genitourinary: Negative for difficulty urinating.  Musculoskeletal: Negative for back pain, neck pain and neck stiffness.  Skin: Negative for pallor.  Neurological: Negative for dizziness, speech difficulty, weakness and headaches.  Psychiatric/Behavioral: Negative for confusion.     Physical Exam Updated Vital Signs BP (!) 104/59   Pulse (!) 58   Temp 98 F (36.7 C) (Oral)   Resp 14   SpO2 100%   Physical Exam Constitutional:      General: He is not in acute distress.    Appearance: Normal appearance. He is not ill-appearing, toxic-appearing or diaphoretic.  HENT:     Head: Normocephalic.     Right Ear: Tympanic membrane, ear canal and external ear normal.     Left Ear: Tenderness present.  No middle ear effusion.     Ears:     Comments: Patient with tenderness to external ear on left side with tenderness to auditory canal as well.  Auditory canal is erythematous with some otorrhea, partially able to visualize part of TM which looks normal.     Mouth/Throat:     Mouth: Mucous membranes are moist.     Pharynx: Oropharynx is clear.  Eyes:     General: No scleral icterus.    Extraocular Movements: Extraocular movements intact.     Pupils: Pupils are equal, round, and reactive to light.  Cardiovascular:     Rate and Rhythm: Normal rate and regular rhythm.     Pulses: Normal pulses.     Heart sounds: Normal heart sounds.     Comments: Radial pulse 2+ bilaterally.  Pulmonary:     Effort: Pulmonary effort is normal. No respiratory distress.     Breath sounds: Normal breath sounds. No stridor. No wheezing, rhonchi or rales.  Chest:     Chest wall: No tenderness.  Abdominal:     General: Abdomen is flat. There is no distension.     Palpations: Abdomen is soft.     Tenderness: There is no abdominal tenderness. There is no guarding or rebound.  Musculoskeletal:        General: No swelling or tenderness. Normal range of motion.     Cervical back: Normal range of motion and neck supple. No rigidity.     Right lower leg: No edema.     Left lower leg: No edema.  Skin:    General: Skin is warm and dry.     Capillary Refill: Capillary refill takes less than 2 seconds.     Coloration: Skin is not pale.  Neurological:     General: No focal deficit present.      Mental Status: He is alert and oriented to person, place, and time.     Cranial Nerves: No cranial nerve deficit.     Sensory: No sensory deficit.     Motor: No weakness.     Coordination: Coordination normal.     Gait: Gait normal.  Psychiatric:        Mood and Affect: Mood normal.        Behavior: Behavior normal.     ED Results / Procedures / Treatments   Labs (all labs ordered are listed, but only abnormal results are displayed) Labs Reviewed  BASIC METABOLIC PANEL - Abnormal; Notable for the following components:      Result Value   Calcium 8.7 (*)    All other components within  normal limits  CBC - Abnormal; Notable for the following components:   RBC 3.56 (*)    Hemoglobin 12.0 (*)    HCT 34.8 (*)    Platelets 149 (*)    All other components within normal limits  D-DIMER, QUANTITATIVE  TROPONIN I (HIGH SENSITIVITY)  TROPONIN I (HIGH SENSITIVITY)    EKG EKG Interpretation  Date/Time:  Tuesday Jun 21 2020 02:57:02 EDT Ventricular Rate:  60 PR Interval:  190 QRS Duration: 74 QT Interval:  410 QTC Calculation: 410 R Axis:   78 Text Interpretation: Normal sinus rhythm Normal ECG No significant change was found Confirmed by Glynn Octaveancour, Stephen [phone]([id number]) on 06/21/2020 6:44:59 AM   Radiology DG Chest 2 View  Result Date: 06/21/2020 CLINICAL DATA:  Chest pain EXAM: CHEST - 2 VIEW COMPARISON:  October 03, 2019 FINDINGS: The heart size and mediastinal contours are within normal limits. No focal consolidation. No pleural effusion. No pneumothorax. The visualized skeletal structures are unremarkable. IMPRESSION: No active cardiopulmonary disease. Electronically Signed   By: Maudry MayhewJeffrey  Waltz MD   On: 06/21/2020 03:34    Procedures Procedures   Medications Ordered in ED Medications  ketorolac (TORADOL) 30 MG/ML injection 30 mg (has no administration in time range)  acetaminophen (TYLENOL) tablet 500 mg (500 mg Oral Given 06/21/20 0730)    ED Course  I have reviewed the  triage vital signs and the nursing notes.  Pertinent labs & imaging results that were available during my care of the patient were reviewed by me and considered in my medical decision making (see chart for details).    MDM Rules/Calculators/A&P                         Vernie AmmonsMichael J Marciel is a 40 y.o. male with past medical history of homelessness, HIV [facility] that presents to the emergency department today for chest pain.  Patient is hemodynamically stable, nontoxic-appearing.  HIV undetectable during last visit less than a year ago.  Differential diagnoses considered include musculoskeletal chest pain, pleuritic chest pain from smoking, anxiety.  Low suspicion for ACS, atypical without any risk factors.  HEAR score 1.  Low suspicion for dissection, chest pain does not radiate to back, is not tearing in nature, normal neuro exam, is intermittent, normal gait, not hypertensive.  Low suspicion for PE, however with pleuritic chest pain and S1Q3T3 on EKG we will rule out with dimer.   Initial interventions Tylenol.  ECG interpreted by me demonstrated S1Q3T3, nonischemic pattern.  CXR interpreted by me demonstrated no acute cardiopulmonary disease.  Labs demonstrated unremarkable CBC and BMP, 2 negative flat troponins.  D-dimer negative.  Work-up reassuring with low chance of PE, myocarditis, ACS, other acute emergent cardiopulmonary disease, however strict return precautions given.    Given the above findings, my suspicion is that patient has pleuritic chest pain from smoking.  No URI symptoms.Pt to follow up with PCP this week.  Patient states that he follows Dr. Juanetta GoslingHawkins, will schedule an appointment with them as soon as possible.    At discharge patient also tells me that he has ear pain on the left ear.  Patient states that it hurts on the outside of his ear, no recent swimming, recent antibiotics, fevers, urinary symptoms.  Has been hurting for a week, auditorycanal does appear very mildly  otorrheeaous with some discomfort on external auditory canal, will prescribe eardrops and have patient follow-up with PCP.  Doubt need for further emergent work up  at this time. I explained the diagnosis and have given explicit precautions to return to the ER including for any other new or worsening symptoms. The patient understands and accepts the medical plan as it's been dictated and I have answered their questions. Discharge instructions concerning home care and prescriptions have been given. The patient is STABLE and is discharged to home in good condition.  I discussed this case with my attending physician who cosigned this note including patient's presenting symptoms, physical exam, and planned diagnostics and interventions. Attending physician stated agreement with plan or made changes to plan which were implemented.   Final Clinical Impression(s) / ED Diagnoses Final diagnoses:  Pleuritic chest pain  Acute otitis externa of left ear, unspecified type    Rx / DC Orders ED Discharge Orders         Ordered    neomycin-polymyxin-hydrocortisone (CORTISPORIN) 3.5-10000-1 OTIC suspension  3 times daily        06/21/20 0823           Farrel Gordon, PA-C 06/21/20 1638    Gerhard Munch, MD 06/21/20 [phone]

## 2020-06-21 NOTE — Discharge Instructions (Signed)
  You were evaluated in the Emergency Department and after careful evaluation, we did not find any emergent condition requiring admission or further testing in the hospital.   Your exam/testing today was overall reassuring. Keep taking ibuprofen at home for the next 5 days as scheduled on the bottle, please  schedule this.  Please use the attached instructions and follow-up with your PCP.  I also prescribed you eardrops for your ear infection.  Imperative that you follow-up with your PCP this week. please return to the Emergency Department if you experience any worsening of your condition.  Thank you for allowing [place] to be a part of your care. Please speak to your pharmacist about any new medications prescribed today in regards to side effects or interactions with other medications.    Get help right away if: Your chest pain is worse. You have a cough that gets worse, or you cough up blood. You have very bad (severe) pain in your belly (abdomen). You pass out (faint). You have either of these for no clear reason: Sudden chest discomfort. Sudden discomfort in your arms, back, neck, or jaw. You have shortness of breath at any time. You suddenly start to sweat, or your skin gets clammy. You feel sick to your stomach (nauseous). You throw up (vomit). You suddenly feel lightheaded or dizzy. You feel very weak or tired. Your heart starts to beat fast, or it feels like it is skipping beats.

## 2020-06-21 NOTE — ED Notes (Signed)
Pt verbalizes understanding of discharge instructions. Opportunity for questions and answers were provided. Armband removed by staff, pt discharged from the ED.  

## 2020-06-21 NOTE — ED Triage Notes (Signed)
Pt comes via [facility] for CP that started tonight, non radiating, no SOB  PTA received 324 ASA

## 2020-09-17 LAB — GLUCOSE, POCT (MANUAL RESULT ENTRY): POC Glucose: 105 mg/dl — AB (ref 70–99)

## 2020-10-24 ENCOUNTER — Emergency Department (HOSPITAL_COMMUNITY)
Admission: EM | Admit: 2020-10-24 | Discharge: 2020-10-24 | Discharge status: Absent without leave | Payer: Medicaid Other

## 2020-10-24 ENCOUNTER — Other Ambulatory Visit: Payer: Self-pay

## 2020-10-26 ENCOUNTER — Encounter (HOSPITAL_COMMUNITY): Payer: Self-pay | Admitting: Emergency Medicine

## 2020-10-26 ENCOUNTER — Ambulatory Visit (HOSPITAL_COMMUNITY)
Admission: EM | Admit: 2020-10-26 | Discharge: 2020-10-26 | Disposition: A | Discharge status: Home / Self Care | Payer: Medicaid Other | Attending: Family Medicine | Admitting: Family Medicine

## 2020-10-26 ENCOUNTER — Other Ambulatory Visit: Payer: Self-pay

## 2020-10-26 DIAGNOSIS — J029 Acute pharyngitis, unspecified: Secondary | ICD-10-CM | POA: Insufficient documentation

## 2020-10-26 LAB — POCT RAPID STREP A, ED / UC: Streptococcus, Group A Screen (Direct): NEGATIVE

## 2020-10-26 LAB — POCT INFECTIOUS MONO SCREEN, ED / UC: Mono Screen: NEGATIVE

## 2020-10-26 NOTE — ED Provider Notes (Signed)
[facility]    CSN: [id number] Arrival date & time: 10/26/20  4403      History   Chief Complaint Chief Complaint  Patient presents with   Sore Throat    HPI Frank Stark is a 40 y.o. male.   HPI  Sore Throat: Pt reports that he has had a sore throat for the past week. Symptoms are stable and not worsening nor are they improving. He has tried OTC sore throat lozenges which have helped. He denies cough, fever, trouble breathing. No new sexual partners.   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 No pertinent surgical history.     Home Medications    Prior to Admission medications   Medication Sig Start Date End Date Taking? Authorizing Provider  Abacavir-Dolutegravir-Lamivud 600-50-300 MG TABS Take 1 tablet by mouth daily. Patient not taking: Reported on 12/31/2019 02/18/14   Gardiner Barefoot, MD  BIKTARVY 50-200-25 MG TABS tablet Take 1 tablet by mouth daily. 11/25/19   [provider]    Family History Family History  Problem Relation Age of Onset   Hypertension Mother    Hypertension Father    Asthma Sister    Diabetes Maternal Grandmother    Dementia Maternal Grandmother    Glaucoma Maternal Grandfather     Social History Social History   Tobacco Use   Smoking status: Every Day    Packs/day: 0.00    Years: 0.00    Pack years: 0.00    Types: Cigarettes   Smokeless tobacco: Never  Substance Use Topics   Alcohol use:  Yes   Drug use: Yes    Types: Marijuana    Comment: occasional     Allergies   Levofloxacin   Review of Systems Review of Systems  As stated above in HPI Physical Exam Triage Vital Signs ED Triage Vitals  Enc Vitals Group     BP 10/26/20 1014 107/65     Pulse Rate 10/26/20 1014 73     Resp 10/26/20 1014 17     Temp 10/26/20 1014 98.3 F (36.8 C)     Temp src --      SpO2 10/26/20 1014 98 %     Weight --      Height --      Head Circumference --      Peak Flow --      Pain Score 10/26/20 1016 0     Pain Loc --      Pain Edu? --      Excl. in GC? --    No data found.  Updated Vital Signs BP 107/65   Pulse 73   Temp 98.3 F (36.8 C)   Resp 17   SpO2 98%   Physical Exam Vitals and nursing note reviewed.  Constitutional:      General: He is not in acute distress.    Appearance: He is well-developed. He is not ill-appearing, toxic-appearing or diaphoretic.  HENT:     Head:  Normocephalic and atraumatic.     Right Ear: Tympanic membrane normal. No middle ear effusion. Tympanic membrane is not erythematous.     Left Ear: Tympanic membrane normal.  No middle ear effusion. Tympanic membrane is not erythematous.     Nose: No congestion or rhinorrhea.     Mouth/Throat:     Mouth: Mucous membranes are dry.     Pharynx: Oropharynx is clear. Uvula midline. Posterior oropharyngeal erythema (mild) present. No pharyngeal swelling, oropharyngeal exudate or uvula swelling.     Tonsils: No tonsillar exudate or tonsillar abscesses. 2+ on the right. 2+ on the left.  Eyes:     Conjunctiva/sclera: Conjunctivae normal.     Pupils: Pupils are equal, round, and reactive to light.  Cardiovascular:     Rate and Rhythm: Normal rate and regular rhythm.     Heart sounds: Normal heart sounds.  Pulmonary:     Effort: Pulmonary effort is normal.     Breath sounds: Normal breath sounds.  Abdominal:     Palpations: Abdomen is soft.     Tenderness: There is no abdominal tenderness.   Musculoskeletal:     Cervical back: Normal range of motion and neck supple.  Lymphadenopathy:     Cervical: Cervical adenopathy present.  Skin:    General: Skin is warm.     Findings: No rash.  Neurological:     Mental Status: He is alert and oriented to person, place, and time.     UC Treatments / Results  Labs (all labs ordered are listed, but only abnormal results are displayed) Labs Reviewed  POCT RAPID STREP A, ED / UC  POCT INFECTIOUS MONO SCREEN, ED / UC  CYTOLOGY, (ORAL, ANAL, URETHRAL) ANCILLARY ONLY    EKG   Radiology No results found.  Procedures Procedures (including critical care time)  Medications Ordered in UC Medications - No data to display  Initial Impression / Assessment and Plan / UC Course  I have reviewed the triage vital signs and the nursing notes.  Pertinent labs & imaging results that were available during my care of the patient were reviewed by me and considered in my medical decision making (see chart for details).     New. Likely viral vs bacterial. Rapid strep is negative. Culture and cytology pending. Mono is negative. For now I have recommended continuation of lozenges until we get results.    Final Clinical Impressions(s) / UC Diagnoses   Final diagnoses:  Sore throat   Discharge Instructions   None    ED Prescriptions   None    PDMP not reviewed this encounter.   Rushie Chestnut, [place] 10/26/20 1112

## 2020-10-26 NOTE — ED Triage Notes (Signed)
Pt states that his tonsils have been swollen for a week. Pt states that it is painful to swallow or eat.

## 2020-10-27 ENCOUNTER — Telehealth (HOSPITAL_COMMUNITY): Payer: Self-pay | Admitting: Emergency Medicine

## 2020-10-27 LAB — CULTURE, GROUP A STREP (THRC)

## 2020-10-27 MED ORDER — PENICILLIN V POTASSIUM 500 MG PO TABS: 500.0000 mg | ORAL_TABLET | Freq: Two times a day (BID) | ORAL | 0 refills | Status: AC

## 2020-10-28 LAB — CYTOLOGY, (ORAL, ANAL, URETHRAL) ANCILLARY ONLY
Chlamydia: NEGATIVE
Comment: NEGATIVE
Comment: NEGATIVE
Comment: NORMAL
Neisseria Gonorrhea: NEGATIVE
Trichomonas: NEGATIVE

## 2021-02-03 ENCOUNTER — Emergency Department (HOSPITAL_BASED_OUTPATIENT_CLINIC_OR_DEPARTMENT_OTHER)
Admission: EM | Admit: 2021-02-03 | Discharge: 2021-02-03 | Disposition: A | Discharge status: Home / Self Care | Payer: Medicaid Other | Source: Home / Self Care

## 2021-02-03 ENCOUNTER — Other Ambulatory Visit: Payer: Self-pay

## 2021-08-24 ENCOUNTER — Emergency Department (HOSPITAL_COMMUNITY)
Admission: EM | Admit: 2021-08-24 | Discharge: 2021-08-24 | Discharge status: Against Medical Advice | Payer: BC Managed Care – PPO | Attending: Emergency Medicine | Admitting: Emergency Medicine

## 2021-08-24 ENCOUNTER — Encounter (HOSPITAL_COMMUNITY): Payer: Self-pay | Admitting: *Deleted

## 2021-08-24 ENCOUNTER — Other Ambulatory Visit: Payer: Self-pay

## 2021-08-24 DIAGNOSIS — T7840XA Allergy, unspecified, initial encounter: Secondary | ICD-10-CM | POA: Insufficient documentation

## 2021-08-24 DIAGNOSIS — Z5321 Procedure and treatment not carried out due to patient leaving prior to being seen by health care provider: Secondary | ICD-10-CM | POA: Diagnosis not present

## 2021-08-24 DIAGNOSIS — L509 Urticaria, unspecified: Secondary | ICD-10-CM | POA: Insufficient documentation

## 2021-08-24 LAB — CBC WITH DIFFERENTIAL/PLATELET
Abs Immature Granulocytes: 0.01 10*3/uL (ref 0.00–0.07)
Basophils Absolute: 0 10*3/uL (ref 0.0–0.1)
Basophils Relative: 1 %
Eosinophils Absolute: 0.1 10*3/uL (ref 0.0–0.5)
Eosinophils Relative: 2 %
HCT: 37 % — ABNORMAL LOW (ref 39.0–52.0)
Hemoglobin: 12.5 g/dL — ABNORMAL LOW (ref 13.0–17.0)
Immature Granulocytes: 0 %
Lymphocytes Relative: 49 %
Lymphs Abs: 1.7 10*3/uL (ref 0.7–4.0)
MCH: 31.3 pg (ref 26.0–34.0)
MCHC: 33.8 g/dL (ref 30.0–36.0)
MCV: 92.5 fL (ref 80.0–100.0)
Monocytes Absolute: 0.5 10*3/uL (ref 0.1–1.0)
Monocytes Relative: 15 %
Neutro Abs: 1.2 10*3/uL — ABNORMAL LOW (ref 1.7–7.7)
Neutrophils Relative %: 33 %
Platelets: 226 10*3/uL (ref 150–400)
RBC: 4 MIL/uL — ABNORMAL LOW (ref 4.22–5.81)
RDW: 13.9 % (ref 11.5–15.5)
WBC: 3.5 10*3/uL — ABNORMAL LOW (ref 4.0–10.5)
nRBC: 0 % (ref 0.0–0.2)

## 2021-08-24 LAB — COMPREHENSIVE METABOLIC PANEL
ALT: 808 U/L — ABNORMAL HIGH (ref 0–44)
AST: 577 U/L — ABNORMAL HIGH (ref 15–41)
Albumin: 3.9 g/dL (ref 3.5–5.0)
Alkaline Phosphatase: 210 U/L — ABNORMAL HIGH (ref 38–126)
Anion gap: 14 (ref 5–15)
BUN: 12 mg/dL (ref 6–20)
CO2: 27 mmol/L (ref 22–32)
Calcium: 9.9 mg/dL (ref 8.9–10.3)
Chloride: 99 mmol/L (ref 98–111)
Creatinine, Ser: 0.97 mg/dL (ref 0.61–1.24)
GFR, Estimated: >60 mL/min (ref >60)
Glucose, Bld: 93 mg/dL (ref 70–99)
Potassium: 3.8 mmol/L (ref 3.5–5.1)
Sodium: 140 mmol/L (ref 135–145)
Total Bilirubin: 1.2 mg/dL (ref 0.3–1.2)
Total Protein: 8.4 g/dL — ABNORMAL HIGH (ref 6.5–8.1)

## 2021-08-24 LAB — C-REACTIVE PROTEIN: CRP: 5.9 mg/dL — ABNORMAL HIGH (ref <1.0)

## 2021-08-24 LAB — SEDIMENTATION RATE: Sed Rate: 68 mm/hr — ABNORMAL HIGH (ref 0–16)

## 2021-08-24 NOTE — ED Notes (Signed)
Patient seen leaving

## 2021-08-24 NOTE — ED Triage Notes (Signed)
Patient c/o swelling with hives to his ankles and arms states his lips also were swollen, no swelling at this time. Able to speak in complete sentences in no acute distress.

## 2021-08-24 NOTE — ED Provider Triage Note (Signed)
  Emergency Medicine Provider Triage Evaluation Note  MRN:  [id number]  Arrival date & time: 08/24/21    Medically screening exam initiated at 4:42 AM.   CC:   Allergic Reaction   HPI:  Frank Stark is a 41 y.o. year-old male presents to the ED with chief complaint of polyarthralgias.  Reports that he had hives a few days ago that have resolved.  Now he has pains in his wrists and ankles.  States that he was in the woods/trees a few days ago.  Denies fever or known tick bite or sting.  Tried benadryl without relief.  Hx of HIV.  Reports compliance with meds.  History provided by patient. ROS:  -As included in HPI PE:   Vitals:   08/24/21 0439  BP: 102/80  Pulse: 97  Resp: 18  Temp: 98.1 F (36.7 C)  SpO2: 100%    Non-toxic appearing No respiratory distress No rash or erythema  MDM:   I've ordered labs in triage to expedite lab/diagnostic workup.  Patient was informed that the remainder of the evaluation will be completed by another provider, this initial triage assessment does not replace that evaluation, and the importance of remaining in the ED until their evaluation is complete.    Roxy Horseman, PA-C 08/24/21 [phone]

## 2021-08-25 LAB — LYME DISEASE SEROLOGY W/REFLEX: Lyme Total Antibody EIA: NEGATIVE

## 2021-08-27 LAB — ROCKY MTN SPOTTED FVR ABS PNL(IGG+IGM)
RMSF IgG: NEGATIVE
RMSF IgM: 0.32 index (ref 0.00–0.89)

## 2022-07-26 ENCOUNTER — Encounter (HOSPITAL_COMMUNITY): Payer: Self-pay | Admitting: Behavioral Health

## 2022-07-26 ENCOUNTER — Ambulatory Visit (HOSPITAL_COMMUNITY)
Admission: EM | Admit: 2022-07-26 | Discharge: 2022-07-26 | Disposition: A | Discharge status: Home / Self Care | Payer: Medicare PPO | Attending: Behavioral Health | Admitting: Behavioral Health

## 2022-07-26 DIAGNOSIS — R442 Other hallucinations: Secondary | ICD-10-CM | POA: Insufficient documentation

## 2022-07-26 DIAGNOSIS — R440 Auditory hallucinations: Secondary | ICD-10-CM | POA: Diagnosis present

## 2022-07-26 DIAGNOSIS — F1910 Other psychoactive substance abuse, uncomplicated: Secondary | ICD-10-CM | POA: Diagnosis not present

## 2022-07-26 DIAGNOSIS — R443 Hallucinations, unspecified: Secondary | ICD-10-CM | POA: Diagnosis not present

## 2022-07-26 DIAGNOSIS — G4700 Insomnia, unspecified: Secondary | ICD-10-CM | POA: Diagnosis not present

## 2022-07-26 NOTE — ED Provider Notes (Signed)
[facility] Medical Screening Exam  Patient Name: Frank Stark MRN: [id number] Date of Evaluation: 07/26/22 Chief Complaint:  "I just needed to get checked out, my parents wanted me to have an evaluation" Diagnosis:  Final diagnoses:  Hallucination    History of Present Illness: Frank Stark is a 42 y.o. male patient with a past psychiatric history of auditory hallucinations and adjustment disorder with depressed mood who presented to [facility] voluntarily and unaccompanied with complaints of ongoing auditory and tactile hallucinations.  Patient assessed face-to-face by this provider and chart reviewed on 07/26/22. On evaluation, Frank Stark is seated in assessment area in no acute distress. Patient is alert and oriented x4, calm, cooperative, and pleasant. Speech is clear and coherent, normal rate and volume. Eye contact is good. Mood is euthymic with congruent affect. Thought process is coherent with logical thought content. Patient denies suicidal and homicidal ideations and easily contracts verbally for safety with this Clinical research associate. Patient denies a history of suicide attempts or self-harm. Patient denies past psychiatric hospitalizations. Patient reports since January 2024, he has been hearing noises that only occur at night at his house when he is laying in bed that sound like mice. Patient states he has also felt movement in his box spring that he believes are mice. Patient states his parents have "tore my room apart and couldn't find anything." Patient states he has difficulty sleeping in his bed because of this but has also felt the same thing before while laying on the couch at night. Patient states "I don't think it's mental" and expresses that he firmly believes there are mice in his room. Patient denies current auditory, visual, and tactile hallucinations. Patient states he has talked with a psychiatrist before about this. Patient states approximately 3 months ago, his  infectious disease doctor prescribed him Seroquel. Patient states while the Seroquel helped him sleep, he stopped taking it "because of the side effects, it worked but I didn't want to be dependent on it." Patient states his infectious disease doctor has also prescribed him PRN Trazodone. Patient reports he still has both medications and will take one or the other sometimes at night to help him sleep. Patient reports last taking Seroquel on Sunday. Patient denies symptoms of paranoia. Patient is able to converse coherently with goal-directed thoughts and no distractibility or preoccupation. Objectively, there is no evidence of psychosis/mania, delusional thinking, or indication that patient is responding to internal or external stimuli.   Patient reports poor sleep (4 hours/night) and good appetite. Patient states he lives currently lives with his mother and father and denies access to firearms/weapons. Patient states he is currently on disability. 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 Patient reports marijuana use "a couple times a month," is unable to specify the amount, and reports last use 2 days ago. Patient reports alcohol use several times a week, stating he last had 4 shots of liquor and 1-2 beers last week. Patient denies use of other illicit substances. Patient reports he was receiving therapy at [facility], but that his therapist quit. Patient states he has a therapy appointment with Dr. Wilber Bihari on 08/07/22. Patient shares that he recently got off of probation in March or April 2024. Patient shares that he had "a bad breakup" in 2021 but denies current stressors. Patient states "I just  needed to get checked out, my parents wanted me to have an evaluation."    Per chart review from [facility] on 01/22/22: "Still having  problems with auditory and tactile hallucinations. Only notes in his house - sounds like mice running in the walls and feels like something moving in the cushions of his couch. Presented in conference and psych feels that is not typical of psychosis, concerned about organic disorder. MRI of brain with contrast on 12/19/21 negative for intracranial abnormality. Normal TSH and B12. Continues with counseling as he feels that it helps" and "2. Auditory/tactile hallucinations - continues to note vibratory sensations along "skittering" noises in house without obvious source, more prevalent in the evening - per Psych, not typical for a primary psych problem, concerning for organic disorder - recent TSH and B12 normal - recent MRI of brain with and without contrast was normal - no improvement with Seroquel x 14 days, would like to try something different to help with sleep, will try trazodone - continue counseling."  Patient offered support and encouragement. Discussed with patient abstaining from use of alcohol or illicit substances. Discussed with patient continuing outpatient therapy. Discussed with patient following up with additional outpatient psychiatric resources provided in AVS for therapy/medication management. Patient is in agreement with plan of care.  At this time, [name] is educated and verbalizes understanding of mental health resources and other crisis services in the community. He is instructed to call 911 and present to the nearest emergency room should he experience any suicidal/homicidal ideation, auditory/visual/hallucinations, or detrimental worsening of his mental health condition.    Flowsheet Row ED from 07/26/2022 in [facility] ED from 08/24/2021 in [facility] Emergency Department at [facility] ED from 10/26/2020 in [facility] at [facility] RISK CATEGORY No Risk No Risk No Risk       Psychiatric Specialty  Exam  Presentation  General Appearance:Appropriate for Environment; Casual  Eye Contact:Good  Speech:Clear and Coherent; Normal Rate  Speech Volume:Normal  Handedness:Right   Mood and Affect  Mood: Euthymic  Affect: Congruent   Thought Process  Thought Processes: Coherent; Goal Directed  Descriptions of Associations:Intact  Orientation:Full (Time, Place and Person)  Thought Content:Logical    Hallucinations:None (Patient denies current AVH but reports auditory and tactile hallucinations of mice in his bed only at night at his home)  Ideas of Reference:None  Suicidal Thoughts:No  Homicidal Thoughts:No   Sensorium  Memory: Immediate Good; Recent Good; Remote Good  Judgment: Fair  Insight: Fair   Art therapist  Concentration: Good  Attention Span: Good  Recall: Good  Fund of Knowledge: Good  Language: Good   Psychomotor Activity  Psychomotor Activity: Normal   Assets  Assets: Communication Skills; Desire for Improvement; Financial Resources/Insurance; Housing; Leisure Time; Physical Health; Resilience; Social Support; Transportation   Sleep  Sleep: Poor  Number of hours:  4   Physical Exam: Physical Exam Vitals and nursing note reviewed.  Constitutional:      General: He is not in acute distress.    Appearance: Normal appearance. He is not ill-appearing.  HENT:     Head: Normocephalic and atraumatic.     Nose: Nose normal.  Eyes:     General:        Right eye: No discharge.        Left eye: No discharge.     Conjunctiva/sclera: Conjunctivae normal.  Cardiovascular:     Rate and Rhythm: Normal  rate.  Pulmonary:     Effort: Pulmonary effort is normal. No respiratory distress.  Musculoskeletal:        General: Normal range of motion.     Cervical back: Normal range of motion.  Skin:    General: Skin is warm and dry.  Neurological:     General: No focal deficit present.     Mental Status: He is alert and  oriented to person, place, and time. Mental status is at baseline.  Psychiatric:        Attention and Perception: Attention and perception normal.        Mood and Affect: Mood and affect normal.        Speech: Speech normal.        Behavior: Behavior normal. Behavior is cooperative.        Thought Content: Thought content normal. Thought content is not paranoid or delusional. Thought content does not include homicidal or suicidal ideation. Thought content does not include homicidal or suicidal plan.        Cognition and Memory: Cognition and memory normal.        Judgment: Judgment normal.     Comments: Patient denies current AVH but reports auditory and tactile hallucinations of mice in his bed only at night at his home    Review of Systems  Constitutional: Negative.   HENT: Negative.    Eyes: Negative.   Respiratory: Negative.    Cardiovascular: Negative.   Gastrointestinal: Negative.   Genitourinary: Negative.   Musculoskeletal: Negative.   Skin: Negative.   Neurological: Negative.   Endo/Heme/Allergies: Negative.   Psychiatric/Behavioral:  Positive for substance abuse. Negative for depression, hallucinations (Patient denies current AVH but reports auditory and tactile hallucinations of mice in his bed only at night at his home), memory loss and suicidal ideas. The patient has insomnia. The patient is not nervous/anxious.    Blood pressure 104/67, pulse 73, temperature 98.2 F (36.8 C), temperature source Oral, resp. rate 18, SpO2 100 %. There is no height or weight on file to calculate BMI.  Musculoskeletal: Strength & Muscle Tone: within normal limits Gait & Station: normal Patient leans: N/A   [facility] MSE Discharge Disposition for Follow up and Recommendations: Based on my evaluation the patient does not appear to have an emergency medical condition and can be discharged with resources and follow up care in outpatient services for Medication Management and Individual  Therapy   Sunday Corn, NP 07/26/2022, 6:58 PM

## 2022-07-26 NOTE — ED Notes (Signed)
Patient is discharging at this time. Printed AVS reviewed with patient by provider along with follow up appointments and resources. All valuables and belongings returned to patient. Patient denies SI,HI, and A/V/H at this time. No s/s of current distress.

## 2022-07-26 NOTE — Discharge Instructions (Addendum)
[facility]: Outpatient psychiatric Services:   Please see the walk in hours listed below.  Medication Management New Patient needing Medication Management Walk-in, and Existing Patients needing to see a provider for management coming as a walk in   Monday thru Friday 8:00 AM first come first serve until slots are full.  Recommend being there by 7:15 AM to ensure a slot is open.  Therapy New Patient Therapy Intake and Existing Patients needing to see therapist coming in as a walk in.   Monday, Wednesday, and Thursday morning at 8:00 am first come first serve.  Recommend being there by 7:15 AM to ensure a slot is open.    Every 1st, 2nd, and 3rd Friday at 1:00 PM first come first serve until slots are full.  Will still need to come in that morning at 7:15 AM to get registered for an afternoon slot.  For all walk-ins we ask that you arrive by 7:15 am because patients will be seen in there order of arrival (FIRST COME FIRST SERVE) Availability is limited, therefore you may not be seen on the same day that you walk in if all slots are full.    Our goal is to serve and meet the needs of our community to the best of our ability.    Based on what you have shared, a list of resources for outpatient therapy and psychiatry is provided below to get you started back on treatment.  It is imperative that you follow through with treatment within 5-7 days from the day of discharge to prevent any further risk to your safety or mental well-being.  You are not limited to the list provided.  In case of an urgent crisis, you may contact the Mobile Crisis Unit with [facility] at [phone].        Outpatient Services for Therapy and Medication Management for Medicaid  [facility] [address] [place], [place], [address] [phone] phone  New Patient Assessment/Therapy Walk-ins Monday and Wednesday: 8am until slots are full. Every 1st and  2nd Friday: 1pm - 5pm  NO ASSESSMENT/THERAPY WALK-INS ON TUESDAYS OR THURSDAYS  New Patient Psychiatry/Medication Management Walk-ins Monday-Friday: 8am-11am  For all walk-ins, we ask that you arrive by 7:30am because patient will be seen in the order of arrival.  Availability is limited; therefore, you may not be seen on the same day that you walk-in.  Our goal is to serve and meet the needs of our community to the best of our ability.   [facility] [address] [place], [place], [address] [phone] phone  [facility] [address] [place], [place], [address] [phone] phone [phone] phone (Aetna, AmeriHealth, Anthem/Elevance, BCBS, Centivo, Cigna/Evernorth, ComPsych, Healthy Blue, Medicaid, Optum, [facility], The Alliance, UMR, UHC, Vaya Health, WellCare, Out of Network)  [facility] [address] [place], [place], [address] [phone] phone (Aetna, Anthem/Elevance, [facility] Health Options/Carelon, BCBS, [facility], Humana, Magellan, MedCost, Medicaid, Medicare, MultiPlan, Tricare, UMR, UHC)  [facility] [address] [place], [place], [address] [phone] phone (Medicaid, ask about other insurance)  [facility] [address] [place], [place], [address] [phone] phone [phone] fax (Aetna, BCBS , Cigna, Medicaid, [place] Health Choice, UHC, TRICARE, Self-Pay)  [facility] [address] [place], [place], [address] [phone] phone (Aetna, Anthem/Elevance, BCBS, [facility], Cigna/Evernorth, Health Choice, Humana, MedCost, Medicaid, Medicare,   Optum, Tricare, UMR, UHC)  [facility] - 6-8 MONTH WAIT FOR THERAPY; SOONER FOR MEDICATION MANAGEMENT [address] [place], [place], [address] [phone] phone (Aetna, AmeriHealth [facility] [place],  BCBS, Cigna, Evernorth, Friday Health Plans, [facility], BCBS Healthy Blue, Humana, Magellan Health, Medcost, Medicare, Medicaid, Optum, Tricare, UHC, UHC Community Plan, Wellcare)  [facility] [address] [place], [place], [address] [phone] phone  [facility] [address] [place], [place], [address] [phone] phone  [facility] [address] [place], [place], [address] [phone] phone  [facility] - THERAPY ONLY [address] [place], [place], [address] [phone] phone  [facility] [address] [place], [place], [address] [phone] phone  [facility] [address] [place], [place], [address] [phone] phone [phone] fax  [facility] [address] [place], [place], [address] [phone] phone [phone] fax  [facility] [address] [place], [place], [address] [phone] phone  [facility] [address] [place], [place], [address]  [phone] phone  [facility]  (Adults Only) [address] [place], , [address]  [phone] phone [phone] fax 

## 2022-07-26 NOTE — Progress Notes (Signed)
   07/26/22 1430  [facility] Triage Screening (Walk-ins at [facility] only)  How Did You Hear About [place]? Self  What Is the Reason for Your Visit/Call Today? Pt presents to  [facility] voluntarily seeking an evaluation.Pt reports auditory hallucinations, hearing noises that he believes is mice under his bed. Pt states "I don't have evidence but  I hear them". Pt denies hallucinations currently. 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 Pt denies SI/HI.  How Long Has This Been Causing You Problems? <Week  Have You Recently Had Any Thoughts About Hurting Yourself? No  Are You Planning to Commit Suicide/Harm Yourself At This time? No  Have you Recently Had Thoughts About Hurting Someone Karolee Ohs? No  Are You Planning To Harm Someone At This Time? No  Are you currently experiencing any auditory, visual or other hallucinations? No  Have You Used Any Alcohol or Drugs in the Past 24 Hours? No  Do you have any current medical co-morbidities that require immediate attention? No  Clinician description of patient physical appearance/behavior: calm, cooperative  What Do You Feel Would Help You the Most Today? Treatment for Depression or other mood problem  If access to [facility] was not available, would you have sought care in the Emergency Department? No  Determination of Need Routine (7 days)  Options For Referral Outpatient Therapy;Medication Management

## 2023-04-22 ENCOUNTER — Emergency Department (HOSPITAL_COMMUNITY)
Admission: EM | Admit: 2023-04-22 | Discharge: 2023-04-22 | Disposition: A | Discharge status: Home / Self Care | Attending: Emergency Medicine | Admitting: Emergency Medicine

## 2023-04-22 ENCOUNTER — Emergency Department (HOSPITAL_COMMUNITY)

## 2023-04-22 ENCOUNTER — Encounter (HOSPITAL_COMMUNITY): Payer: Self-pay

## 2023-04-22 DIAGNOSIS — J210 Acute bronchiolitis due to respiratory syncytial virus: Secondary | ICD-10-CM | POA: Diagnosis not present

## 2023-04-22 DIAGNOSIS — F17210 Nicotine dependence, cigarettes, uncomplicated: Secondary | ICD-10-CM | POA: Diagnosis not present

## 2023-04-22 DIAGNOSIS — Z21 Asymptomatic human immunodeficiency virus [HIV] infection status: Secondary | ICD-10-CM | POA: Diagnosis not present

## 2023-04-22 DIAGNOSIS — R059 Cough, unspecified: Secondary | ICD-10-CM | POA: Diagnosis present

## 2023-04-22 LAB — BASIC METABOLIC PANEL
Anion gap: 9 (ref 5–15)
BUN: 16 mg/dL (ref 6–20)
CO2: 22 mmol/L (ref 22–32)
Calcium: 8.7 mg/dL — ABNORMAL LOW (ref 8.9–10.3)
Chloride: 107 mmol/L (ref 98–111)
Creatinine, Ser: 1.06 mg/dL (ref 0.61–1.24)
GFR, Estimated: >60 mL/min (ref >60)
Glucose, Bld: 98 mg/dL (ref 70–99)
Potassium: 4.4 mmol/L (ref 3.5–5.1)
Sodium: 138 mmol/L (ref 135–145)

## 2023-04-22 LAB — CBC WITH DIFFERENTIAL/PLATELET
Abs Immature Granulocytes: 0.02 10*3/uL (ref 0.00–0.07)
Basophils Absolute: 0 10*3/uL (ref 0.0–0.1)
Basophils Relative: 1 %
Eosinophils Absolute: 0.1 10*3/uL (ref 0.0–0.5)
Eosinophils Relative: 4 %
HCT: 37.3 % — ABNORMAL LOW (ref 39.0–52.0)
Hemoglobin: 12.2 g/dL — ABNORMAL LOW (ref 13.0–17.0)
Immature Granulocytes: 1 %
Lymphocytes Relative: 36 %
Lymphs Abs: 1.4 10*3/uL (ref 0.7–4.0)
MCH: 32.3 pg (ref 26.0–34.0)
MCHC: 32.7 g/dL (ref 30.0–36.0)
MCV: 98.7 fL (ref 80.0–100.0)
Monocytes Absolute: 0.4 10*3/uL (ref 0.1–1.0)
Monocytes Relative: 10 %
Neutro Abs: 2 10*3/uL (ref 1.7–7.7)
Neutrophils Relative %: 48 %
Platelets: 190 10*3/uL (ref 150–400)
RBC: 3.78 MIL/uL — ABNORMAL LOW (ref 4.22–5.81)
RDW: 13.2 % (ref 11.5–15.5)
WBC: 4 10*3/uL (ref 4.0–10.5)
nRBC: 0 % (ref 0.0–0.2)

## 2023-04-22 LAB — RESP PANEL BY RT-PCR (RSV, FLU A&B, COVID)  RVPGX2
Influenza A by PCR: NEGATIVE
Influenza B by PCR: NEGATIVE
Resp Syncytial Virus by PCR: POSITIVE — AB
SARS Coronavirus 2 by RT PCR: NEGATIVE

## 2023-04-22 MED ORDER — ACETAMINOPHEN 325 MG PO TABS: 650.0000 mg | ORAL_TABLET | Freq: Four times a day (QID) | ORAL | 0 refills | Status: AC | PRN

## 2023-04-22 MED ORDER — OXYMETAZOLINE HCL 0.05 % NA SOLN: 1.0000 | Freq: Two times a day (BID) | NASAL | 0 refills | Status: AC

## 2023-04-22 MED ORDER — GUAIFENESIN-CODEINE 100-10 MG/5ML PO SOLN: 5.0000 mL | Freq: Three times a day (TID) | ORAL | 0 refills | Status: DC | PRN

## 2023-04-22 MED ORDER — ALBUTEROL SULFATE HFA 108 (90 BASE) MCG/ACT IN AERS: 1.0000 | INHALATION_SPRAY | Freq: Four times a day (QID) | RESPIRATORY_TRACT | 0 refills | Status: AC | PRN

## 2023-04-22 NOTE — ED Provider Notes (Signed)
 [place] EMERGENCY DEPARTMENT AT [facility] Provider Note  CSN: [id number] Arrival date & time: 04/22/23 1349  Chief Complaint(s) Cough, Sore Throat, and Nasal Congestion  HPI Frank Stark is a 43 y.o. male with past medical history as below, significant for HIV on Biktarvy with good compliance, sciatica, syphilis who presents to the ED with complaint of cough, URI symptoms over the past week  Patient with cough, congestion, rhinorrhea, postnasal drip, some difficulty breathing.  Ongoing over the past week, mildly improved past 24 hours.  Taking over-the-counter cough medication without much relief.  No fevers does have some occasional chills.  No recent travel or sick contacts.  No change in bowel or bladder function.  Compliant with his home medications.  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 Authorizing Provider  acetaminophen (TYLENOL) 325 MG tablet Take 2 tablets (650 mg total) by mouth every 6 (six) hours as needed. 04/22/23  Yes Tanda Rockers A, DO  albuterol (VENTOLIN HFA) 108 (90 Base) MCG/ACT inhaler Inhale 1-2 puffs into the lungs every 6 (six) hours as needed for wheezing or shortness of breath. 04/22/23  Yes Tanda Rockers A, DO  guaiFENesin-codeine 100-10 MG/5ML syrup Take 5 mLs by mouth 3 (three) times daily as needed for cough. 04/22/23   Yes Sloan Leiter, DO  oxymetazoline (AFRIN NASAL SPRAY) 0.05 % nasal spray Place 1 spray into both nostrils 2 (two) times daily for 3 days. 04/22/23 04/25/23 Yes Tanda Rockers A, DO  Abacavir-Dolutegravir-Lamivud 600-50-300 MG TABS Take 1 tablet by mouth daily. Patient not taking: Reported on 12/31/2019 02/18/14   Gardiner Barefoot, MD  BIKTARVY 50-200-25 MG TABS tablet Take 1 tablet by mouth daily. 11/25/19   [provider]                                                                                                                                    Past Surgical History History reviewed. No pertinent surgical history. Family History Family History  Problem Relation Age of Onset   Hypertension Mother    Hypertension Father    Asthma Sister    Diabetes Maternal Grandmother    Dementia Maternal Grandmother    Glaucoma Maternal Grandfather     Social History Social History   Tobacco Use   Smoking status: Every Day  Current packs/day: 0.00    Types: Cigarettes   Smokeless tobacco: Never  Vaping Use   Vaping status: Every Day  Substance Use Topics   Alcohol use: Yes   Drug use: Yes    Types: Marijuana    Comment: occasional   Allergies Levofloxacin  Review of Systems A thorough review of systems was obtained and all systems are negative except as noted in the HPI and PMH.   Physical Exam Vital Signs  I have reviewed the triage vital signs BP 122/76   Pulse 66   Temp 98.8 F (37.1 C) (Oral)   Resp 14   Ht 5\' 11"  (1.803 m)   Wt 66.2 kg   SpO2 100%   BMI 20.36 kg/m  Physical Exam Vitals and nursing note reviewed.  Constitutional:      General: He is not in acute distress.    Appearance: He is well-developed.  HENT:     Head: Normocephalic and atraumatic.     Right Ear: External ear normal.     Left Ear: External ear normal.     Mouth/Throat:     Mouth: Mucous membranes are moist.  Eyes:     General: No scleral icterus. Cardiovascular:     Rate  and Rhythm: Normal rate and regular rhythm.     Pulses: Normal pulses.     Heart sounds: Normal heart sounds.  Pulmonary:     Effort: Pulmonary effort is normal. No respiratory distress.     Breath sounds: Normal breath sounds. No wheezing.  Abdominal:     General: Abdomen is flat.     Palpations: Abdomen is soft.     Tenderness: There is no abdominal tenderness.  Musculoskeletal:     Cervical back: No rigidity.     Right lower leg: No edema.     Left lower leg: No edema.  Skin:    General: Skin is warm and dry.     Capillary Refill: Capillary refill takes less than 2 seconds.  Neurological:     Mental Status: He is alert.  Psychiatric:        Mood and Affect: Mood normal.        Behavior: Behavior normal.     ED Results and Treatments Labs (all labs ordered are listed, but only abnormal results are displayed) Labs Reviewed  RESP PANEL BY RT-PCR (RSV, FLU A&B, COVID)  RVPGX2 - Abnormal; Notable for the following components:      Result Value   Resp Syncytial Virus by PCR POSITIVE (*)    All other components within normal limits  CBC WITH DIFFERENTIAL/PLATELET - Abnormal; Notable for the following components:   RBC 3.78 (*)    Hemoglobin 12.2 (*)    HCT 37.3 (*)    All other components within normal limits  BASIC METABOLIC PANEL - Abnormal; Notable for the following components:   Calcium 8.7 (*)    All other components within normal limits  Radiology DG Chest 2 View Result Date: 04/22/2023 CLINICAL DATA:  Cough and wheezing. EXAM: CHEST - 2 VIEW COMPARISON:  06/21/2020 FINDINGS: The heart size and mediastinal contours are within normal limits. Both lungs are clear. The visualized skeletal structures are unremarkable. IMPRESSION: No active cardiopulmonary disease. Electronically Signed   By: Danae Orleans M.D.   On: 04/22/2023 16:05    Pertinent labs &  imaging results that were available during my care of the patient were reviewed by me and considered in my medical decision making (see MDM for details).  Medications Ordered in ED Medications - No data to display                                                                                                                                   Procedures Procedures  (including critical care time)  Medical Decision Making / ED Course    Medical Decision Making:    Frank Stark is a 43 y.o. male with past medical history as below, significant for HIV on Biktarvy with good compliance, sciatica, syphilis who presents to the ED with complaint of cough, URI symptoms over the past week. The complaint involves an extensive differential diagnosis and also carries with it a high risk of complications and morbidity.  Serious etiology was considered. Ddx includes but is not limited to: In my evaluation of this patient's dyspnea my DDx includes, but is not limited to, pneumonia, pulmonary embolism, pneumothorax, pulmonary edema, metabolic acidosis, asthma, COPD, cardiac cause, anemia, anxiety, etc.    Complete initial physical exam performed, notably the patient was in no acute distress, breathing comfortably on ambient air.    Reviewed and confirmed nursing documentation for past medical history, family history, social history.  Vital signs reviewed.    Clinical Course as of 04/22/23 2131  Mon Apr 22, 2023  1905 Respiratory Syncytial Virus by PCR(!): POSITIVE [SG]    Clinical Course User Index [SG] Sloan Leiter, DO    Brief summary: 43 year old male with history as above here with URI type symptoms.  Labs and imaging reviewed from triage.  X-ray is reassuring, labs are stable.  He is positive RSV.  This is likely the source of his symptoms today.  He has no hypoxia, blood pressure stable.  Is tolerant p.o. the difficulty.  Lungs clear to auscultation.  No evidence of underlying pneumonia.  No  fever.  He is not septic.  Overall well-appearing, will give medications to help for symptomatic treatment at home.  Encourage follow with PCP  The patient improved significantly and was discharged in stable condition. Detailed discussions were had with the patient/guardian regarding current findings, and need for close f/u with PCP or on call doctor. The patient/guardian has been instructed to return immediately if the symptoms worsen in any way for re-evaluation. Patient/guardian verbalized understanding and is in agreement with current care plan. All questions answered prior to discharge.  Additional history obtained: -Additional history obtained from na -External records from outside source obtained and reviewed including: Chart review including previous notes, labs, imaging, consultation notes including  Home medications, primary care documentation He follows with infectious disease at [facility]   Lab Tests: -I ordered, reviewed, and interpreted labs.   The pertinent results include:   Labs Reviewed  RESP PANEL BY RT-PCR (RSV, FLU A&B, COVID)  RVPGX2 - Abnormal; Notable for the following components:      Result Value   Resp Syncytial Virus by PCR POSITIVE (*)    All other components within normal limits  CBC WITH DIFFERENTIAL/PLATELET - Abnormal; Notable for the following components:   RBC 3.78 (*)    Hemoglobin 12.2 (*)    HCT 37.3 (*)    All other components within normal limits  BASIC METABOLIC PANEL - Abnormal; Notable for the following components:   Calcium 8.7 (*)    All other components within normal limits    Notable for rsv +  EKG   EKG Interpretation Date/Time:    Ventricular Rate:    PR Interval:    QRS Duration:    QT Interval:    QTC Calculation:   R Axis:      Text Interpretation:           Imaging Studies ordered: I ordered imaging studies including cxr I independently visualized the following imaging with scope of  interpretation limited to determining acute life threatening conditions related to emergency care; findings noted above I independently visualized and interpreted imaging. I agree with the radiologist interpretation   Medicines ordered and prescription drug management: Meds ordered this encounter  Medications   guaiFENesin-codeine 100-10 MG/5ML syrup    Sig: Take 5 mLs by mouth 3 (three) times daily as needed for cough.    Dispense:  120 mL    Refill:  0   oxymetazoline (AFRIN NASAL SPRAY) 0.05 % nasal spray    Sig: Place 1 spray into both nostrils 2 (two) times daily for 3 days.    Dispense:  15 mL    Refill:  0   albuterol (VENTOLIN HFA) 108 (90 Base) MCG/ACT inhaler    Sig: Inhale 1-2 puffs into the lungs every 6 (six) hours as needed for wheezing or shortness of breath.    Dispense:  1 each    Refill:  0   acetaminophen (TYLENOL) 325 MG tablet    Sig: Take 2 tablets (650 mg total) by mouth every 6 (six) hours as needed.    Dispense:  36 tablet    Refill:  0    -I have reviewed the patients home medicines and have made adjustments as needed   Consultations Obtained: na   Cardiac Monitoring: Continuous pulse oximetry interpreted by myself, 100% on RA.    Social Determinants of Health:  Diagnosis or treatment significantly limited by social determinants of health: current smoker   Reevaluation: After the interventions noted above, I reevaluated the patient and found that they have improved  Co morbidities that complicate the patient evaluation  Past Medical History:  Diagnosis Date   HIV (human immunodeficiency virus infection) (HCC)       Dispostion: Disposition decision including need for hospitalization was considered, and patient discharged from emergency department.    Final Clinical Impression(s) / ED Diagnoses Final diagnoses:  RSV (acute bronchiolitis due to respiratory syncytial virus)        Sloan Leiter, DO 04/22/23 2131

## 2023-04-22 NOTE — ED Provider Triage Note (Signed)
 Emergency Medicine Provider Triage Evaluation Note  Frank Stark , a 43 y.o. male  was evaluated in triage.  Pt complains of cough, congestion, shob for a week. Hx of ashtma, pneumonia, HIV. No fever at home, no known sick contacts.  Review of Systems  Positive: Shob, cough, congestion Negative: Chest pain  Physical Exam  BP 125/85 (BP Location: Right Arm)   Pulse 83   Temp 98.3 F (36.8 C) (Oral)   Resp 18   Ht 5\' 11"  (1.803 m)   Wt 66.2 kg   SpO2 94%   BMI 20.36 kg/m  Gen:   Awake, no distress   Resp:  Normal effort  MSK:   Moves extremities without difficulty  Other:  Some wheezing, rhonchi throughout, concerning for possible focal consolidation in the upper lobes.  Medical Decision Making  Medically screening exam initiated at 2:42 PM.  Appropriate orders placed.  Frank Stark was informed that the remainder of the evaluation will be completed by another provider, this initial triage assessment does not replace that evaluation, and the importance of remaining in the ED until their evaluation is complete.  Workup initiated in triage    Olene Floss, [place] 04/22/23 1444

## 2023-04-22 NOTE — ED Triage Notes (Signed)
 Pt c/o cough, nasal congestion, and sore throat x1 week.  Pt reports taking OTC medications w/o relief.

## 2023-04-22 NOTE — Discharge Instructions (Addendum)
 It was a pleasure caring for you today in the emergency department.  Be sure to drink plenty of fluids, get plenty of rest over the next few days. Follow up with your pcp for recheck in the next week  You were prescribed a cough medication with codeine, this can make you sleepy. Do not drive after taking this or drink any alcohol with it or take it with any other opiate/narcotic medications.   Please return to the emergency department for any worsening or worrisome symptoms.

## 2023-10-15 ENCOUNTER — Other Ambulatory Visit: Payer: Self-pay

## 2023-10-15 ENCOUNTER — Emergency Department (HOSPITAL_COMMUNITY)
Admission: EM | Admit: 2023-10-15 | Discharge: 2023-10-16 | Disposition: A | Discharge status: Other Acute Inpatient Hospital | Source: Home / Self Care | Attending: Emergency Medicine | Admitting: Emergency Medicine

## 2023-10-15 ENCOUNTER — Encounter (HOSPITAL_COMMUNITY): Payer: Self-pay | Admitting: Emergency Medicine

## 2023-10-15 DIAGNOSIS — Z79899 Other long term (current) drug therapy: Secondary | ICD-10-CM | POA: Insufficient documentation

## 2023-10-15 DIAGNOSIS — R443 Hallucinations, unspecified: Secondary | ICD-10-CM | POA: Insufficient documentation

## 2023-10-15 DIAGNOSIS — F333 Major depressive disorder, recurrent, severe with psychotic symptoms: Secondary | ICD-10-CM | POA: Diagnosis not present

## 2023-10-15 DIAGNOSIS — Z21 Asymptomatic human immunodeficiency virus [HIV] infection status: Secondary | ICD-10-CM | POA: Insufficient documentation

## 2023-10-15 HISTORY — DX: Anxiety disorder, unspecified: F41.9

## 2023-10-15 HISTORY — DX: Depression, unspecified: F32.A

## 2023-10-15 NOTE — ED Triage Notes (Signed)
 Pt to ED from home voluntarily c/o auditory hallucinations for last month.  States hearing voices, denies visual hallucinations.  Denies SI/HI.  Denies drugs or alcohol tonight, taking medications as prescribed.  Pt calm and cooperative in triage, brought in by mother who he lives with.

## 2023-10-16 ENCOUNTER — Inpatient Hospital Stay (HOSPITAL_COMMUNITY)
Admission: AD | Admit: 2023-10-16 | Discharge: 2023-10-23 | DRG: 885 | Disposition: A | Discharge status: Home / Self Care | Source: Intra-hospital

## 2023-10-16 ENCOUNTER — Encounter (HOSPITAL_COMMUNITY): Payer: Self-pay | Admitting: Psychiatry

## 2023-10-16 DIAGNOSIS — R7303 Prediabetes: Secondary | ICD-10-CM | POA: Diagnosis present

## 2023-10-16 DIAGNOSIS — G4700 Insomnia, unspecified: Secondary | ICD-10-CM | POA: Diagnosis present

## 2023-10-16 DIAGNOSIS — F17290 Nicotine dependence, other tobacco product, uncomplicated: Secondary | ICD-10-CM | POA: Diagnosis present

## 2023-10-16 DIAGNOSIS — Z21 Asymptomatic human immunodeficiency virus [HIV] infection status: Secondary | ICD-10-CM | POA: Diagnosis present

## 2023-10-16 DIAGNOSIS — Z79899 Other long term (current) drug therapy: Secondary | ICD-10-CM

## 2023-10-16 DIAGNOSIS — Z8249 Family history of ischemic heart disease and other diseases of the circulatory system: Secondary | ICD-10-CM

## 2023-10-16 DIAGNOSIS — Z833 Family history of diabetes mellitus: Secondary | ICD-10-CM

## 2023-10-16 DIAGNOSIS — Z825 Family history of asthma and other chronic lower respiratory diseases: Secondary | ICD-10-CM | POA: Diagnosis not present

## 2023-10-16 DIAGNOSIS — E7800 Pure hypercholesterolemia, unspecified: Secondary | ICD-10-CM | POA: Diagnosis present

## 2023-10-16 DIAGNOSIS — R443 Hallucinations, unspecified: Secondary | ICD-10-CM | POA: Diagnosis present

## 2023-10-16 DIAGNOSIS — F17210 Nicotine dependence, cigarettes, uncomplicated: Secondary | ICD-10-CM | POA: Diagnosis present

## 2023-10-16 DIAGNOSIS — Z881 Allergy status to other antibiotic agents status: Secondary | ICD-10-CM | POA: Diagnosis not present

## 2023-10-16 DIAGNOSIS — F419 Anxiety disorder, unspecified: Secondary | ICD-10-CM | POA: Diagnosis present

## 2023-10-16 DIAGNOSIS — R45851 Suicidal ideations: Secondary | ICD-10-CM | POA: Diagnosis present

## 2023-10-16 DIAGNOSIS — F333 Major depressive disorder, recurrent, severe with psychotic symptoms: Secondary | ICD-10-CM | POA: Diagnosis present

## 2023-10-16 LAB — CBC
HCT: 36.9 % — ABNORMAL LOW (ref 39.0–52.0)
Hemoglobin: 12.1 g/dL — ABNORMAL LOW (ref 13.0–17.0)
MCH: 32.5 pg (ref 26.0–34.0)
MCHC: 32.8 g/dL (ref 30.0–36.0)
MCV: 99.2 fL (ref 80.0–100.0)
Platelets: 160 K/uL (ref 150–400)
RBC: 3.72 MIL/uL — ABNORMAL LOW (ref 4.22–5.81)
RDW: 13.2 % (ref 11.5–15.5)
WBC: 4.5 K/uL (ref 4.0–10.5)
nRBC: 0 % (ref 0.0–0.2)

## 2023-10-16 LAB — COMPREHENSIVE METABOLIC PANEL WITH GFR
ALT: 19 U/L (ref 0–44)
AST: 22 U/L (ref 15–41)
Albumin: 4.4 g/dL (ref 3.5–5.0)
Alkaline Phosphatase: 86 U/L (ref 38–126)
Anion gap: 12 (ref 5–15)
BUN: 17 mg/dL (ref 6–20)
CO2: 26 mmol/L (ref 22–32)
Calcium: 9.8 mg/dL (ref 8.9–10.3)
Chloride: 102 mmol/L (ref 98–111)
Creatinine, Ser: 1.15 mg/dL (ref 0.61–1.24)
GFR, Estimated: >60 mL/min (ref >60)
Glucose, Bld: 104 mg/dL — ABNORMAL HIGH (ref 70–99)
Potassium: 4.6 mmol/L (ref 3.5–5.1)
Sodium: 140 mmol/L (ref 135–145)
Total Bilirubin: 0.3 mg/dL (ref 0.0–1.2)
Total Protein: 7.4 g/dL (ref 6.5–8.1)

## 2023-10-16 LAB — ETHANOL: Alcohol, Ethyl (B): <15 mg/dL (ref <15)

## 2023-10-16 LAB — URINE DRUG SCREEN
Amphetamines: NEGATIVE
Barbiturates: NEGATIVE
Benzodiazepines: NEGATIVE
Cocaine: NEGATIVE
Fentanyl: NEGATIVE
Methadone Scn, Ur: NEGATIVE
Opiates: NEGATIVE
Tetrahydrocannabinol: NEGATIVE

## 2023-10-16 MED ORDER — GABAPENTIN 300 MG PO CAPS
300.0000 mg | ORAL_CAPSULE | Freq: Every day | ORAL | Status: DC
  Administered 2023-10-17 – 2023-10-23 (×7): 300 mg via ORAL
  Filled 2023-10-16 (×7): qty 1

## 2023-10-16 MED ORDER — DIPHENHYDRAMINE HCL 25 MG PO CAPS
50.0000 mg | ORAL_CAPSULE | Freq: Three times a day (TID) | ORAL | Status: DC | PRN
  Administered 2023-10-16: 50 mg via ORAL
  Filled 2023-10-16: qty 2

## 2023-10-16 MED ORDER — HALOPERIDOL LACTATE 5 MG/ML IJ SOLN: 5.0000 mg | Freq: Three times a day (TID) | INTRAMUSCULAR | Status: DC | PRN

## 2023-10-16 MED ORDER — TRAZODONE HCL 50 MG PO TABS
50.0000 mg | ORAL_TABLET | Freq: Every day | ORAL | Status: DC
Start: 2023-10-16 — End: 2023-10-18
  Administered 2023-10-16 – 2023-10-17 (×2): 50 mg via ORAL
  Filled 2023-10-16 (×2): qty 1

## 2023-10-16 MED ORDER — HALOPERIDOL LACTATE 5 MG/ML IJ SOLN: 10.0000 mg | Freq: Three times a day (TID) | INTRAMUSCULAR | Status: DC | PRN

## 2023-10-16 MED ORDER — FLUOXETINE HCL 20 MG PO CAPS
40.0000 mg | ORAL_CAPSULE | Freq: Every day | ORAL | Status: DC
  Administered 2023-10-16: 40 mg via ORAL
  Filled 2023-10-16: qty 2

## 2023-10-16 MED ORDER — ALUM & MAG HYDROXIDE-SIMETH 200-200-20 MG/5ML PO SUSP: 30.0000 mL | ORAL | Status: DC | PRN

## 2023-10-16 MED ORDER — ACETAMINOPHEN 325 MG PO TABS
650.0000 mg | ORAL_TABLET | Freq: Four times a day (QID) | ORAL | Status: DC | PRN
  Administered 2023-10-19 – 2023-10-20 (×4): 650 mg via ORAL
  Filled 2023-10-16 (×4): qty 2

## 2023-10-16 MED ORDER — HALOPERIDOL 5 MG PO TABS
5.0000 mg | ORAL_TABLET | Freq: Three times a day (TID) | ORAL | Status: DC | PRN
  Administered 2023-10-16: 5 mg via ORAL
  Filled 2023-10-16: qty 1

## 2023-10-16 MED ORDER — FLUOXETINE HCL 20 MG PO CAPS
40.0000 mg | ORAL_CAPSULE | Freq: Every day | ORAL | Status: DC
  Administered 2023-10-17 – 2023-10-19 (×3): 40 mg via ORAL
  Filled 2023-10-16 (×3): qty 2

## 2023-10-16 MED ORDER — QUETIAPINE FUMARATE 100 MG PO TABS: 400.0000 mg | ORAL_TABLET | Freq: Every day | ORAL | Status: DC

## 2023-10-16 MED ORDER — GABAPENTIN 300 MG PO CAPS
300.0000 mg | ORAL_CAPSULE | Freq: Every day | ORAL | Status: DC
  Administered 2023-10-16: 300 mg via ORAL
  Filled 2023-10-16: qty 1

## 2023-10-16 MED ORDER — QUETIAPINE FUMARATE 200 MG PO TABS
400.0000 mg | ORAL_TABLET | Freq: Every day | ORAL | Status: DC
  Administered 2023-10-16 – 2023-10-19 (×4): 400 mg via ORAL
  Filled 2023-10-16 (×6): qty 2

## 2023-10-16 MED ORDER — BICTEGRAVIR-EMTRICITAB-TENOFOV 50-200-25 MG PO TABS
1.0000 | ORAL_TABLET | Freq: Every day | ORAL | Status: DC
Start: 2023-10-17 — End: 2023-10-23
  Administered 2023-10-17 – 2023-10-23 (×7): 1 via ORAL
  Filled 2023-10-16 (×7): qty 1

## 2023-10-16 MED ORDER — NAPROXEN 500 MG PO TABS
250.0000 mg | ORAL_TABLET | Freq: Three times a day (TID) | ORAL | Status: DC
  Filled 2023-10-16: qty 1

## 2023-10-16 MED ORDER — QUETIAPINE FUMARATE 200 MG PO TABS: 400.0000 mg | ORAL_TABLET | Freq: Every day | ORAL | Status: DC

## 2023-10-16 MED ORDER — NAPROXEN 250 MG PO TABS
250.0000 mg | ORAL_TABLET | Freq: Three times a day (TID) | ORAL | Status: DC
  Administered 2023-10-16 – 2023-10-23 (×20): 250 mg via ORAL
  Filled 2023-10-16 (×21): qty 1

## 2023-10-16 MED ORDER — DIPHENHYDRAMINE HCL 50 MG/ML IJ SOLN: 50.0000 mg | Freq: Three times a day (TID) | INTRAMUSCULAR | Status: DC | PRN

## 2023-10-16 MED ORDER — BICTEGRAVIR-EMTRICITAB-TENOFOV 50-200-25 MG PO TABS
1.0000 | ORAL_TABLET | Freq: Every day | ORAL | Status: DC
Start: 2023-10-16 — End: 2023-10-16
  Administered 2023-10-16: 1 via ORAL
  Filled 2023-10-16 (×2): qty 1

## 2023-10-16 MED ORDER — LORAZEPAM 2 MG/ML IJ SOLN: 2.0000 mg | Freq: Three times a day (TID) | INTRAMUSCULAR | Status: DC | PRN

## 2023-10-16 MED ORDER — MAGNESIUM HYDROXIDE 400 MG/5ML PO SUSP: 30.0000 mL | Freq: Every day | ORAL | Status: DC | PRN

## 2023-10-16 NOTE — ED Provider Notes (Signed)
 [place] EMERGENCY DEPARTMENT AT [facility] Provider Note   CSN: [id number] Arrival date & time: 10/15/23  2312     Patient presents with: Hallucinations (/)   Frank Stark is a 43 y.o. male.   The history is provided by the patient and medical records.   43 year old male with history of HIV, anxiety, depression, presenting to the ED with hallucinations.  This has been ongoing for the past month.  He was previously on Prozac  and amitriptyline from PCP.  Once he saw psychiatry they took him off the amitriptyline as they thought maybe this was contributing to his hallucinations.  His dose of Seroquel  was increased but he has not noticed any improvement.  States hallucinations are becoming more frequent.  Notably he has hearing voices and has tactile hallucinations like vibrations.  He does report some command hallucinations.  Recently as these are happening nearly constantly, he is finding it hard to function normally and feels like they are grossly impacting his way of life.  He has become depressed about this and has had some thoughts of suicide as he is frustrated that no one else can hear them or understand what he is going through.  He is not sleeping very well, only getting a few hours a night as he is having a hard time getting his brain to rest.  He denies any HI.  Denies any drug or alcohol use.  Prior to Admission medications   Medication Sig Start Date End Date Taking? Authorizing Provider  Abacavir -Dolutegravir -Lamivud 600-50-300 MG TABS Take 1 tablet by mouth daily. Patient not taking: Reported on 12/31/2019 02/18/14   Efrain Lamar ORN, MD  acetaminophen  (TYLENOL ) 325 MG tablet Take 2 tablets (650 mg total) by mouth every 6 (six) hours as needed. 04/22/23   Elnor Jayson LABOR, DO  albuterol  (VENTOLIN  HFA) 108 (90 Base) MCG/ACT inhaler Inhale 1-2 puffs into the lungs every 6 (six) hours as needed for wheezing or shortness of breath. 04/22/23   Elnor Jayson LABOR, DO  BIKTARVY   50-200-25 MG TABS tablet Take 1 tablet by mouth daily. 11/25/19   [provider]  guaiFENesin -codeine  100-10 MG/5ML syrup Take 5 mLs by mouth 3 (three) times daily as needed for cough. 04/22/23   Elnor Jayson LABOR, DO    Allergies: Levofloxacin    Review of Systems  Psychiatric/Behavioral:  Positive for hallucinations.   All other systems reviewed and are negative.   Updated Vital Signs BP 126/81   Pulse 92   Temp 98.3 F (36.8 C)   Resp 18   Ht 5' 10 (1.778 m)   Wt 75.3 kg   SpO2 97%   BMI 23.82 kg/m   Physical Exam Vitals and nursing note reviewed.  Constitutional:      Appearance: He is well-developed.  HENT:     Head: Normocephalic and atraumatic.  Eyes:     Conjunctiva/sclera: Conjunctivae normal.     Pupils: Pupils are equal, round, and reactive to light.  Cardiovascular:     Rate and Rhythm: Normal rate and regular rhythm.     Heart sounds: Normal heart sounds.  Pulmonary:     Effort: Pulmonary effort is normal.     Breath sounds: Normal breath sounds.  Abdominal:     General: Bowel sounds are normal.     Palpations: Abdomen is soft.  Musculoskeletal:        General: Normal range of motion.     Cervical back: Normal range of motion.  Skin:  General: Skin is warm and dry.  Neurological:     Mental Status: He is alert and oriented to person, place, and time.  Psychiatric:     Comments: Auditory and tactile hallucinations endorsed, seems to have insight into this, is not responding to internal stimuli during exam     (all labs ordered are listed, but only abnormal results are displayed) Labs Reviewed  COMPREHENSIVE METABOLIC PANEL WITH GFR - Abnormal; Notable for the following components:      Result Value   Glucose, Bld 104 (*)    All other components within normal limits  CBC - Abnormal; Notable for the following components:   RBC 3.72 (*)    Hemoglobin 12.1 (*)    HCT 36.9 (*)    All other components within normal limits  ETHANOL  URINE  DRUG SCREEN    EKG: None  Radiology: No results found.   Procedures   Medications Ordered in the ED - No data to display                                  Medical Decision Making Amount and/or Complexity of Data Reviewed Labs: ordered. ECG/medicine tests: ordered and independent interpretation performed.   43 year old male presenting to the ED with hallucinations.  Ongoing for the past month or so but seeming to increase in frequency.  Reports auditory and tactile hallucinations.  Admits to some SI due to his frustrations with this.  No attempted self-harm.  Is awake, alert, oriented here.  He does not appear to be responding to internal stimuli currently.  Labs obtained from triage are reassuring without leukocytosis or electrolyte derangement.  Ethanol and UDS are negative.  Patient requested his home meds.  Reviewed home meds and most recent visit notes in chart.  He tells me that he was supposed to be tapering off amitriptyline and increasing his Seroquel , however notes from recent visit report the opposite.  He is adamant this was the instructions he was given.  Pharmacy tech reviewed, same story so not entirely sure which medications he is supposed to be taking.  Will consult TTS for further recommendations.  TTS has evaluated, inpatient hospitalization recommended.  They will seek placement.  Final diagnoses:  Hallucinations    ED Discharge Orders     None          Jarold Olam HERO, PA-C 10/16/23 0446    Theadore Ozell HERO, MD 10/16/23 [phone]

## 2023-10-16 NOTE — H&P (Signed)
 Psychiatric Admission Assessment Adult  Patient Identification: Frank Stark  MRN:  [id number]  Date of Evaluation:  10/16/2023  Chief Complaint:  Hallucinations [R44.3]  Principal Diagnosis: Severe recurrent major depressive disorder with psychotic features (HCC)  Diagnosis:  Principal Problem:   Severe recurrent major depressive disorder with psychotic features (HCC) Active Problems:   Hallucinations  History of Present Illness: This is the first psychiatric admission/evaluation in this [facility] for this 43 year old AA male. Admitted to the [facility] from the [facility] long hospital with complaints of worsening auditory hallucinations. (Below information from behavioral health assessment has been reviewed by me and I agreed with the findings): 43 year old male with history of HIV, anxiety, depression, presenting to the ED with hallucinations.  This has been ongoing for the past month.  He was previously on Prozac  and amitriptyline from PCP.  Once he saw psychiatry they took him off the amitriptyline as they thought maybe this was contributing to his hallucinations.  His dose of Seroquel  was increased but he has not noticed any improvement.  States hallucinations are becoming more frequent.  Notably he has hearing voices and has tactile hallucinations like vibrations.  He does report some command hallucinations.  Recently as these are happening nearly constantly, he is finding it hard to function normally and feels like they are grossly impacting his way of life.  He has become depressed about this and has had some thoughts of suicide as he is frustrated that no one else can hear them or understand what he is going through.  He is not sleeping very well, only getting a few hours a night as he is having a hard time getting his brain to rest.  He denies any HI.  Denies any drug or alcohol use. However, says he uses cannabis edibles.   Objective: Patient was lying down in bed during this evaluation. He  presents alert, oriented & aware of situation. He presents with a good affect, good eye contact & verbally responsive. He presents with a nonchalant attitude during this evaluation. He presents as a good historian, however, the first thing this patient asked this provider was, Now that my HIV virus is undetectable, can I have sex with someone without passing the virus. Patient was asked by this provider how often he sees his infectious disease doctor? He replied every 4 months. Patient is instructed that this should be a good question for his infectious disease doctor. And for the time being, to not engage in a sex act with anyone without using protection & to assure that his partner also uses protection. See the treatment plan below.  Associated Signs/Symptoms:  Depression Symptoms:  depressed mood, insomnia, anxiety,  (Hypo) Manic Symptoms:  Impulsivity,  Anxiety Symptoms:  Excessive Worry,  Psychotic Symptoms:  Hallucinations: Auditory  PTSD Symptoms: NA  Total Time spent with patient: 1.5 hours  Past Psychiatric History: Major depressive disorder, recurrent episodes, Anxiety disorder.  Is the patient at risk to self? No.  Has the patient been a risk to self in the past 6 months? Yes.    Has the patient been a risk to self within the distant past? Yes.    Is the patient a risk to others? No.  Has the patient been a risk to others in the past 6 months? No.  Has the patient been a risk to others within the distant past? No.   [place] Scale:  Flowsheet Row Ad[facility] (Current) [facility]  from 10/15/2023 in [facility] Department at [facility] ED from 04/22/2023 in [facility] Emergency Department at [facility]  C-SSRS RISK CATEGORY High Risk High Risk No Risk     Prior Inpatient Therapy: No. If yes, describe: NA  Prior Outpatient Therapy: Yes.   If yes, describe: [facility].   Alcohol Screening:    Substance Abuse History in the last 12 months:  No.  Consequences of Substance Abuse: NA  Previous Psychotropic Medications: Yes   Psychological Evaluations: Yes   Past Medical History:  Past Medical History:  Diagnosis Date   Anxiety    Depression    HIV (human immunodeficiency virus infection) (HCC)    History reviewed. No pertinent surgical history. Family History:  Family History  Problem Relation Age of Onset   Hypertension Mother    Hypertension Father    Asthma Sister    Diabetes Maternal Grandmother    Dementia Maternal Grandmother    Glaucoma Maternal Grandfather    Family Psychiatric  History: Patient denies any familial hx of mental illnesses. Denies any suicides in his family.  Tobacco Screening:  Social History   Tobacco Use  Smoking Status Every Day   Current packs/day: 0.00   Types: Cigarettes  Smokeless Tobacco Never    BH Tobacco Counseling     Are you interested in Tobacco Cessation Medications?  No value filed. Counseled patient on smoking cessation:  No value filed. Reason Tobacco Screening Not Completed: No value filed.       Social History: Patient reports being single, has no children, unemployed, lives in [place], [place]. Social History   Substance and Sexual Activity  Alcohol Use Not Currently   Comment: rarely     Social History   Substance and Sexual Activity  Drug Use Not Currently   Comment: occasional    Additional Social History:  Allergies:   Allergies  Allergen Reactions   Levofloxacin Other (See Comments)    Confusion (intolerance)   Lab Results:  Results for orders placed or performed during the hospital encounter of 10/15/23 (from the past 48 hours)  Comprehensive metabolic panel     Status: Abnormal   Collection Time: 10/15/23 11:58 PM  Result Value Ref Range   Sodium 140 135 - 145 mmol/L   Potassium 4.6 3.5 - 5.1 mmol/L   Chloride 102 98 - 111 mmol/L   CO2 26 22 - 32 mmol/L   Glucose, Bld 104 (H) 70  - 99 mg/dL    Comment: Glucose reference range applies only to samples taken after fasting for at least 8 hours.   BUN 17 6 - 20 mg/dL   Creatinine, Ser 8.84 0.61 - 1.24 mg/dL   Calcium 9.8 8.9 - 89.6 mg/dL   Total Protein 7.4 6.5 - 8.1 g/dL   Albumin 4.4 3.5 - 5.0 g/dL   AST 22 15 - 41 U/L   ALT 19 0 - 44 U/L   Alkaline Phosphatase 86 38 - 126 U/L   Total Bilirubin 0.3 0.0 - 1.2 mg/dL   GFR, Estimated >39 >39 mL/min    Comment: (NOTE) Calculated using the CKD-EPI Creatinine Equation (2021)    Anion gap 12 5 - 15    Comment: Performed at [facility], [address], [place], [place] [address]  cbc     Status: Abnormal   Collection Time: 10/15/23 11:58 PM  Result Value Ref Range   WBC 4.5 4.0 - 10.5 K/uL   RBC 3.72 (L) 4.22 -  5.81 MIL/uL   Hemoglobin 12.1 (L) 13.0 - 17.0 g/dL   HCT 63.0 (L) 60.9 - 47.9 %   MCV 99.2 80.0 - 100.0 fL   MCH 32.5 26.0 - 34.0 pg   MCHC 32.8 30.0 - 36.0 g/dL   RDW 86.7 88.4 - 84.4 %   Platelets 160 150 - 400 K/uL   nRBC 0.0 0.0 - 0.2 %    Comment: Performed at [facility], [address] [place], [place] [address]  Ethanol     Status: None   Collection Time: 10/15/23 11:59 PM  Result Value Ref Range   Alcohol, Ethyl (B) <15 <15 mg/dL    Comment: (NOTE) For medical purposes only. Performed at [facility], [address], [place], [place] [address]   Rapid urine drug screen (hospital performed)     Status: None   Collection Time: 10/15/23 11:59 PM  Result Value Ref Range   Opiates NEGATIVE NEGATIVE   Cocaine NEGATIVE NEGATIVE   Benzodiazepines NEGATIVE NEGATIVE   Amphetamines NEGATIVE NEGATIVE   Tetrahydrocannabinol NEGATIVE NEGATIVE   Barbiturates NEGATIVE NEGATIVE   Methadone Scn, Ur NEGATIVE NEGATIVE   Fentanyl  NEGATIVE NEGATIVE    Comment: (NOTE) Drug screen is for Medical Purposes only. Positive results are preliminary only. If confirmation is needed, notify lab within  5 days.  Drug Class                 Cutoff (ng/mL) Amphetamine and metabolites 1000 Barbiturate and metabolites 200 Benzodiazepine              200 Opiates and metabolites     300 Cocaine and metabolites     300 THC                         50 Fentanyl                     5 Methadone                   300  Trazodone  is metabolized in vivo to several metabolites,  including pharmacologically active m-CPP, which is excreted in the  urine.  Immunoassay screens for amphetamines and MDMA have potential  cross-reactivity with these compounds and may provide false positive  result.  Performed at [facility], [address] [place], [place] [address]     Blood Alcohol level:  Lab Results  Component Value Date   [facility] <15 10/15/2023   Metabolic Disorder Labs:  No results found for: HGBA1C, MPG No results found for: PROLACTIN Lab Results  Component Value Date   CHOL 121 02/26/2012   TRIG 72 02/26/2012   HDL 36 (L) 02/26/2012   CHOLHDL 3.4 02/26/2012   VLDL 14 02/26/2012   LDLCALC 71 02/26/2012   Current Medications: Current Facility-Administered Medications  Medication Dose Route Frequency Provider Last Rate Last Admin   acetaminophen  (TYLENOL ) tablet 650 mg  650 mg Oral Q6H PRN Weber, Kyra A, NP       alum & mag hydroxide-simeth (MAALOX/MYLANTA) 200-200-20 MG/5ML suspension 30 mL  30 mL Oral Q4H PRN Weber, Kyra A, NP       [START ON 10/17/2023] bictegravir-emtricitabine -tenofovir  AF (BIKTARVY ) 50-200-25 MG per tablet 1 tablet  1 tablet Oral Daily Weber, Kyra A, NP       haloperidol  (HALDOL ) tablet 5 mg  5 mg Oral TID PRN Weber, Kyra A, NP   5 mg at 10/16/23  1533   And   diphenhydrAMINE  (BENADRYL ) capsule 50 mg  50 mg Oral TID PRN Weber, Kyra A, NP   50 mg at 10/16/23 1533   haloperidol  lactate (HALDOL ) injection 5 mg  5 mg Intramuscular TID PRN Weber, Kyra A, NP       And   diphenhydrAMINE  (BENADRYL ) injection 50 mg  50 mg Intramuscular TID PRN Weber,  Kyra A, NP       And   LORazepam  (ATIVAN ) injection 2 mg  2 mg Intramuscular TID PRN Weber, Kyra A, NP       haloperidol  lactate (HALDOL ) injection 10 mg  10 mg Intramuscular TID PRN Weber, Kyra A, NP       And   diphenhydrAMINE  (BENADRYL ) injection 50 mg  50 mg Intramuscular TID PRN Weber, Kyra A, NP       And   LORazepam  (ATIVAN ) injection 2 mg  2 mg Intramuscular TID PRN Weber, Kyra A, NP       [START ON 10/17/2023] FLUoxetine  (PROZAC ) capsule 40 mg  40 mg Oral Daily Weber, Kyra A, NP       [START ON 10/17/2023] gabapentin  (NEURONTIN ) capsule 300 mg  300 mg Oral Daily Weber, Kyra A, NP       magnesium  hydroxide (MILK OF MAGNESIA) suspension 30 mL  30 mL Oral Daily PRN Weber, Kyra A, NP       naproxen  (NAPROSYN ) tablet 250 mg  250 mg Oral TID WC Weber, Kyra A, NP       QUEtiapine  (SEROQUEL ) tablet 400 mg  400 mg Oral QHS Sherman Donaldson I, NP       traZODone  (DESYREL ) tablet 50 mg  50 mg Oral QHS Diya Gervasi I, NP       PTA Medications: Medications Prior to Admission  Medication Sig Dispense Refill Last Dose/Taking   acetaminophen  (TYLENOL ) 325 MG tablet Take 2 tablets (650 mg total) by mouth every 6 (six) hours as needed. 36 tablet 0    albuterol  (VENTOLIN  HFA) 108 (90 Base) MCG/ACT inhaler Inhale 1-2 puffs into the lungs every 6 (six) hours as needed for wheezing or shortness of breath. 1 each 0    BIKTARVY  50-200-25 MG TABS tablet Take 1 tablet by mouth daily.      FLUoxetine  (PROZAC ) 40 MG capsule Take 40 mg by mouth daily.      gabapentin  (NEURONTIN ) 300 MG capsule Take 300 mg by mouth daily.      guaiFENesin -codeine  100-10 MG/5ML syrup Take 5 mLs by mouth 3 (three) times daily as needed for cough. 120 mL 0    meloxicam  (MOBIC ) 7.5 MG tablet Take 7.5 mg by mouth daily.      QUEtiapine  (SEROQUEL ) 400 MG tablet Take 400 mg by mouth at bedtime.      AIMS:  ,  ,  ,  ,  ,  ,    Musculoskeletal: Strength & Muscle Tone: within normal limits Gait & Station: normal Patient leans:  N/A  Psychiatric Specialty Exam:  Presentation  General Appearance: Casual; Fairly Groomed  Eye Contact:Fair  Speech:Clear and Coherent; Normal Rate  Speech Volume:Normal  Handedness:Right   Mood and Affect  Mood:Depressed; Anxious  Affect:Non-Congruent   Thought Process  Thought Processes:Coherent; Goal Directed; Linear  Duration of Psychotic Symptoms:Greater than 3 months. Past Diagnosis of Schizophrenia or Psychoactive disorder: No  Descriptions of Associations:Intact  Orientation:Full (Time, Place and Person)  Thought Content:Logical  Hallucinations:Hallucinations: Auditory Description of Auditory Hallucinations: Come out.  Ideas of Reference:None  Suicidal  Thoughts:Suicidal Thoughts: No  Homicidal Thoughts:Homicidal Thoughts: No   Sensorium  Memory:Immediate Good; Recent Good; Remote Good  Judgment:Fair  Insight:Fair   Executive Functions  Concentration:Good  Attention Span:Good  Recall:Good  Fund of Knowledge:Fair  Language:Good   Psychomotor Activity  Psychomotor Activity:Psychomotor Activity: Normal   Assets  Assets:Communication Skills; Desire for Improvement; Housing; Social Support   Sleep  Sleep:Sleep: Fair  Estimated Sleeping Duration (Last 24 Hours): 0.00 hours   Physical Exam: Physical Exam Vitals and nursing note reviewed.  HENT:     Head: Normocephalic.     Nose: Nose normal.     Mouth/Throat:     Pharynx: Oropharynx is clear.  Musculoskeletal:     Cervical back: Normal range of motion.  Neurological:     Mental Status: He is alert.    ROS Blood pressure 112/85, pulse 85, temperature 98.3 F (36.8 C), temperature source Oral, resp. rate 16, height 5' 11 (1.803 m), weight 75.8 kg, SpO2 100%. Body mass index is 23.29 kg/m.  Treatment Plan Summary: Daily contact with patient to assess and evaluate symptoms and progress in treatment and Medication management.   Principal/active diagnoses.  Severe  recurrent major depressive disorder with psychotic features (HCC)  Plan: The risks/benefits/side-effects/alternatives to the medications in use were discussed in detail with the patient and time was given for patient's questions. The patient consents to medication trial.   -Continue Seroquel  400 mg po Q bedtime for sleep.  -Continue Fluoxetine  40 mg po daily for depression.  -Continue gabapentin  300 mg po for nerve pain.  -Initiated hydroxyzine 25 mg po tid prn for anxiety.  -Continue Trazodone  50 mg po Q hs for insomnia.   Agitation protocols.  -Continue as recommended (see MAR).  Medical.  -Continue Biktarvy  50-200-25 po daily for HIV infection.  Other PRNS -Continue Tylenol  650 mg every 6 hours PRN for mild pain -Continue Maalox 30 ml Q 4 hrs PRN for indigestion -Continue MOM 30 ml po Q 6 hrs for constipation  Safety and Monitoring: Voluntary admission to inpatient psychiatric unit for safety, stabilization and treatment Daily contact with patient to assess and evaluate symptoms and progress in treatment Patient's case to be discussed in multi-disciplinary team meeting Observation Level : q15 minute checks Vital signs: q12 hours Precautions: Safety  Discharge Planning: Social work and case management to assist with discharge planning and identification of hospital follow-up needs prior to discharge Estimated LOS: 5-7 days Discharge Concerns: Need to establish a safety plan; Medication compliance and effectiveness Discharge Goals: Return home with outpatient referrals for mental health follow-up including medication management/psychotherapy  Observation Level/Precautions:  15 minute checks  Laboratory:  Per ED, current lab results reviewed.  Psychotherapy: Enrolled in the group sessions.  Medications:  See MAR.  Consultations: As needed.   Discharge Concerns: Safety, mood stability.  Estimated LOS: 3-5 days  Other: NA    Physician Treatment Plan for Primary Diagnosis:  Severe recurrent major depressive disorder with psychotic features (HCC) Long Term Goal(s): Improvement in symptoms so as ready for discharge  Short Term Goals: Ability to identify changes in lifestyle to reduce recurrence of condition will improve, Ability to verbalize feelings will improve, Ability to disclose and discuss suicidal ideas, and Ability to demonstrate self-control will improve  Physician Treatment Plan for Secondary Diagnosis: Principal Problem:   Severe recurrent major depressive disorder with psychotic features (HCC) Active Problems:   Hallucinations  Long Term Goal(s): Improvement in symptoms so as ready for discharge  Short Term Goals: Ability to identify  and develop effective coping behaviors will improve, Ability to maintain clinical measurements within normal limits will improve, Compliance with prescribed medications will improve, and Ability to identify triggers associated with substance abuse/mental health issues will improve  I certify that inpatient services furnished can reasonably be expected to improve the patient's condition.    Mac Bolster, NP, pmhnp, fnp-bc. 9/10/20254:07 PM

## 2023-10-16 NOTE — ED Notes (Signed)
 EKG performed and given to Dr. Emil

## 2023-10-16 NOTE — Progress Notes (Signed)
 Pt has been accepted to [facility] on 10/16/2023 Bed assignment: 403-01  Pt meets inpatient criteria per: Roxianne Olp, NP   Attending Physician will be: Dr. Raliegh MD  Report can be called to:e unit: Adult unit: [phone]  Pt can arrive after ASAP   Care Team Notified: [facility] RN, Mardy Cornelia    [place] Natlie Asfour LCSW-A   10/16/2023 11:40 AM

## 2023-10-16 NOTE — BH Assessment (Signed)
 Comprehensive Clinical Assessment (CCA) Note  10/16/2023 Frank Stark [id number]  Disposition: Per Roxianne Olp, NP,  patient is recommended for inpatient admission.  Disposition SW to pursue appropriate inpatient options.  The patient demonstrates the following risk factors for suicide: Chronic risk factors for suicide include: previous suicide attempts  . Acute risk factors for suicide include: social withdrawal/isolation. Protective factors for this patient include: positive social support. Considering these factors, the overall suicide risk at this point appears to be high. Patient is not appropriate for outpatient follow up.   Patient is a 43 year old male with a history of anxiety disorder and depression who presents voluntarily to [facility] ED  for an assessment. Patient resides in the home with his parents and identifies them as their primary support system.Patient reports auditory hallucinations. Pt states,  they tell me to come outside. Pt reports voices have become worse in the past month. Patient reports history of past suicide attempts, last occurrence was two days ago when patient overdosed on his Seroquel . Pt is prescribed 400 mg but he took 800 mg. Pt did not seek medical attention during that time.   Patient denies substance use. Patient reports NSSIB, SI, AVH. Pt denies HI.   Patient identifies his primary stressors as his mental health. Patient denies history of abuse or trauma. Patient denies current legal problems. Patient is receiving outpatient therapy and psychiatry services, with Dr. Waddell at [facility] . Patient reports he  takes his medications as prescribed (see MAR) and reports recent medication changes. Pt reports that a week ago his Seroquel  increased from 200mg  to 400mg . Patient denies previous inpatient admission.  Patient denies access to weapons.   Patient is can to contract for safety outside of the hospital.  Patient gives verbal  consent for [facility] to speak with his mother.  Per mother patient is responding to internal stimuli. Mother reports that pt gets frustrated with his auditory hallucinations to the point he is crying and upset .   Treatment options were discussed and patient is in agreement with recommendation for inpatient.   During evaluation pt is in no acute distress. He is alert, oriented x 4, calm, cooperative and attentive. his mood is flat and positive with congruent affect. He has normal speech, and behavior.  Objectively there is no evidence of psychosis/mania or delusional thinking.  Patient is able to converse coherently, goal directed thoughts, no distractibility, or pre-occupation.   He also denies suicidal/self-harm/homicidal ideation, psychosis, and paranoia.  Patient answered question appropriately.        Chief Complaint:  Chief Complaint  Patient presents with   Hallucinations        Visit Diagnosis: Hallucinations     CCA Screening, Triage and Referral (STR)  Patient Reported Information How did you hear about us ? Other (Comment) ([facility] ED)  What Is the Reason for Your Visit/Call Today? Per EDP's note: 43 year old male with history of HIV, anxiety, depression, presenting to the ED with hallucinations.  This has been ongoing for the past month.  He was previously on Prozac  and amitriptyline from PCP.  Once he saw psychiatry they took him off the amitriptyline as they thought maybe this was contributing to his hallucinations.  His dose of Seroquel  was increased but he has not noticed any improvement.  States hallucinations are becoming more frequent.  Notably he has hearing voices and has tactile hallucinations like vibrations.  He does report some command hallucinations.  Recently as these are happening nearly  constantly, he is finding it hard to function normally and feels like they are grossly impacting his way of life.  He has become depressed about this and has had some thoughts of suicide  as he is frustrated that no one else can hear them or understand what he is going through.  He is not sleeping very well, only getting a few hours a night as he is having a hard time getting his brain to rest.  He denies any HI.  Denies any drug or alcohol use.       How Long Has This Been Causing You Problems? > than 6 months  What Do You Feel Would Help You the Most Today? Treatment for Depression or other mood problem; Stress Management   Have You Recently Had Any Thoughts About Hurting Yourself? No  Are You Planning to Commit Suicide/Harm Yourself At This time? No   Flowsheet Row ED from 10/15/2023 in [facility] at [facility] ED from 04/22/2023 in [facility] Emergency Department at [facility] ED from 07/26/2022 in [facility]  C-SSRS RISK CATEGORY High Risk No Risk No Risk    Have you Recently Had Thoughts About Hurting Someone Sherral? No  Are You Planning to Harm Someone at This Time? No  Explanation: Denies HI   Have You Used Any Alcohol or Drugs in the Past 24 Hours? No  How Long Ago Did You Use Drugs or Alcohol? No data recorded What Did You Use and How Much? No data recorded  Do You Currently Have a Therapist/Psychiatrist? Yes  Name of Therapist/Psychiatrist: Name of Therapist/Psychiatrist: Psychiatrist - Waddell at [facility] [place]  Therapist : Joesph Gala in [place]   Have You Been Recently Discharged From Any Office Practice or Programs? No  Explanation of Discharge From Practice/Program: No data recorded    CCA Screening Triage Referral Assessment Type of Contact: Tele-Assessment  Telemedicine Service Delivery: Telemedicine service delivery: This service was provided via telemedicine using a 2-way, interactive audio and video technology  Is this Initial or Reassessment? Is this Initial or Reassessment?: Initial Assessment  Date Telepsych consult ordered in [facility]:   Date Telepsych consult ordered in [facility]: 10/16/23  Time Telepsych consult ordered in [facility]:  Time Telepsych consult ordered in [facility]: 0135  Location of Assessment: [facility] ED  Provider Location: [facility] Assessment Services   Collateral Involvement: Octavious, Zidek (Mother)  [phone] (Mobile)   Does Patient Have a Automotive engineer Guardian? No  Legal Guardian Contact Information: n/a  Copy of Legal Guardianship Form: -- (n/a)  Legal Guardian Notified of Arrival: -- (n/a)  Legal Guardian Notified of Pending Discharge: -- (n/a)  If Minor and Not Living with Parent(s), Who has Custody? n/a  Is CPS involved or ever been involved? Never  Is APS involved or ever been involved? No data recorded  Patient Determined To Be At Risk for Harm To Self or Others Based on Review of Patient Reported Information or Presenting Complaint? No  Method: No Plan  Availability of Means: No access or NA  Intent: Vague intent or NA  Notification Required: No need or identified person  Additional Information for Danger to Others Potential: -- (n/a)  Additional Comments for Danger to Others Potential: n/a  Are There Guns or Other Weapons in Your Home? No  Types of Guns/Weapons: Denies access  Are These Weapons Safely Secured?  No  Who Could Verify You Are Able To Have These Secured: Denies access  Do You Have any Outstanding Charges, Pending Court Dates, Parole/Probation? Denies pending legal charges  Contacted To Inform of Risk of Harm To Self or Others: -- (n/a)    Does Patient Present under Involuntary Commitment? No    [place] of Residence: [place]   Patient Currently Receiving the Following Services: Medication Management; Individual Therapy   Determination of Need: Urgent (48 hours)   Options For Referral: Inpatient Hospitalization; Medication Management     CCA Biopsychosocial Patient Reported Schizophrenia/Schizoaffective Diagnosis in Past:  No   Strengths: Willing to seek treatment   Mental Health Symptoms Depression:  None   Duration of Depressive symptoms:    Mania:  None   Anxiety:   Worrying   Psychosis:  Hallucinations (Pt reports hearing voices)   Duration of Psychotic symptoms: Duration of Psychotic Symptoms: Greater than six months   Trauma:  None   Obsessions:  None   Compulsions:  None   Inattention:  None   Hyperactivity/Impulsivity:  None   Oppositional/Defiant Behaviors:  None   Emotional Irregularity:  None   Other Mood/Personality Symptoms:  None reported    Mental Status Exam Appearance and self-care  Stature:  Average   Weight:  Average weight   Clothing:  Casual (scrubs)   Grooming:  Normal   Cosmetic use:  None   Posture/gait:  Normal   Motor activity:  Not Remarkable   Sensorium  Attention:  Normal   Concentration:  Normal   Orientation:  X5   Recall/memory:  Normal   Affect and Mood  Affect:  Appropriate   Mood:  Euthymic   Relating  Eye contact:  Normal   Facial expression:  Responsive   Attitude toward examiner:  Cooperative   Thought and Language  Speech flow: Clear and Coherent   Thought content:  Appropriate to Mood and Circumstances   Preoccupation:  None   Hallucinations:  Auditory   Organization:  Patent examiner of Knowledge:  Average   Intelligence:  Average   Abstraction:  Normal   Judgement:  Fair   Dance movement psychotherapist:  Adequate   Insight:  Good   Decision Making:  Confused   Social Functioning  Social Maturity:  Impulsive   Social Judgement:  Victimized   Stress  Stressors:  Other (Comment) (Mental health)   Coping Ability:  Exhausted; Overwhelmed   Skill Deficits:  Communication; Decision making   Supports:  Family     Religion: Religion/Spirituality Are You A Religious Person?: No How Might This Affect Treatment?: n/a  Leisure/Recreation: Leisure / Recreation Do You Have Hobbies?:  No  Exercise/Diet: Exercise/Diet Do You Exercise?: No Have You Gained or Lost A Significant Amount of Weight in the Past Six Months?: No Do You Follow a Special Diet?: No Do You Have Any Trouble Sleeping?: Yes Explanation of Sleeping Difficulties: Pt reports difficulty falling asleep   CCA Employment/Education Employment/Work Situation: Employment / Work Situation Employment Situation: Unemployed Patient's Job has Been Impacted by Current Illness: No Has Patient ever Been in Equities trader?: No  Education: Education Is Patient Currently Attending School?: No Last Grade Completed: 12 Did You Product manager?: No Did You Have An Individualized Education Program (IIEP): No Did You Have Any Difficulty At School?: No Patient's Education Has Been Impacted by Current Illness: No   CCA Family/Childhood History Family and Relationship History: Family history Marital status: Single Does patient have children?: No  Childhood History:  Childhood History By whom was/is the patient raised?: Both parents Did patient suffer any verbal/emotional/physical/sexual abuse as a child?: No Did patient suffer from severe childhood neglect?: No Has patient ever been sexually abused/assaulted/raped as an adolescent or adult?: No Was the patient ever a victim of a crime or a disaster?: No Witnessed domestic violence?: No Has patient been affected by domestic violence as an adult?: No       CCA Substance Use Alcohol/Drug Use: Alcohol / Drug Use Pain Medications: See MAR Prescriptions: See MAR Over the Counter: See MAR History of alcohol / drug use?: No history of alcohol / drug abuse Longest period of sobriety (when/how long): Denies drug use/ etoh use Negative Consequences of Use:  (n/a) Withdrawal Symptoms:  (n/a)                         ASAM's:  Six Dimensions of Multidimensional Assessment  Dimension 1:  Acute Intoxication and/or Withdrawal Potential:   Dimension 1:   Description of individual's past and current experiences of substance use and withdrawal: n/a  Dimension 2:  Biomedical Conditions and Complications:   Dimension 2:  Description of patient's biomedical conditions and  complications: n/a  Dimension 3:  Emotional, Behavioral, or Cognitive Conditions and Complications:  Dimension 3:  Description of emotional, behavioral, or cognitive conditions and complications: n/a  Dimension 4:  Readiness to Change:  Dimension 4:  Description of Readiness to Change criteria: n/a  Dimension 5:  Relapse, Continued use, or Continued Problem Potential:  Dimension 5:  Relapse, continued use, or continued problem potential critiera description: n/a  Dimension 6:  Recovery/Living Environment:  Dimension 6:  Recovery/Iiving environment criteria description: n/a  ASAM Severity Score:    ASAM Recommended Level of Treatment: ASAM Recommended Level of Treatment:  (n/a)   Substance use Disorder (SUD) Substance Use Disorder (SUD)  Checklist Symptoms of Substance Use:  (n/a)  Recommendations for Services/Supports/Treatments: Recommendations for Services/Supports/Treatments Recommendations For Services/Supports/Treatments:  (n/a)  Disposition Recommendation per psychiatric provider: We recommend inpatient psychiatric hospitalization when medically cleared. Patient is under voluntary admission status at this time; please IVC if attempts to leave hospital.   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

## 2023-10-16 NOTE — ED Notes (Addendum)
 Report called RN Prentice

## 2023-10-16 NOTE — ED Notes (Signed)
Safe transport called 

## 2023-10-16 NOTE — ED Notes (Signed)
 Patient dress out. Mother took the patient's belongings home.

## 2023-10-16 NOTE — BHH Suicide Risk Assessment (Signed)
 Suicide Risk Assessment  Admission Assessment    [facility] Admission Suicide Risk Assessment   Nursing information obtained from:  Patient  Demographic factors:  Male, Low socioeconomic status  Current Mental Status:  NA  Loss Factors:  NA  Historical Factors:  Prior suicide attempts  Risk Reduction Factors:  Sense of responsibility to family, Living with another person, especially a relative  Total Time spent with patient: 1.5 hours  Principal Problem: Severe recurrent major depressive disorder with psychotic features (HCC)  Diagnosis:  Principal Problem:   Severe recurrent major depressive disorder with psychotic features (HCC) Active Problems:   Hallucinations  Subjective Data: See H&P.  Continued Clinical Symptoms:    The Alcohol Use Disorders Identification Test, Guidelines for Use in Primary Care, Second Edition.  World Science writer [facility]). Score between 0-7:  no or low risk or alcohol related problems. Score between 8-15:  moderate risk of alcohol related problems. Score between 16-19:  high risk of alcohol related problems. Score 20 or above:  warrants further diagnostic evaluation for alcohol dependence and treatment.  CLINICAL FACTORS:   Depression:   Insomnia Previous Psychiatric Diagnoses and Treatments Medical Diagnoses and Treatments/Surgeries  Musculoskeletal: Strength & Muscle Tone: within normal limits Gait & Station: normal Patient leans: N/A  Psychiatric Specialty Exam:  Presentation  General Appearance: Casual; Fairly Groomed  Eye Contact:Fair  Speech:Clear and Coherent; Normal Rate  Speech Volume:Normal  Handedness:Right   Mood and Affect  Mood:Depressed; Anxious  Affect:Non-Congruent   Thought Process  Thought Processes:Coherent; Goal Directed; Linear  Descriptions of Associations:Intact  Orientation:Full (Time, Place and Person)  Thought Content:Logical  History of Schizophrenia/Schizoaffective  disorder:No  Duration of Psychotic Symptoms:N/A  Hallucinations:Hallucinations: Auditory Description of Auditory Hallucinations: Come out.  Ideas of Reference:None  Suicidal Thoughts:Suicidal Thoughts: No  Homicidal Thoughts:Homicidal Thoughts: No   Sensorium  Memory:Immediate Good; Recent Good; Remote Good  Judgment:Fair  Insight:Fair   Executive Functions  Concentration:Good  Attention Span:Good  Recall:Good  Fund of Knowledge:Fair  Language:Good   Psychomotor Activity  Psychomotor Activity:Psychomotor Activity: Normal   Assets  Assets:Communication Skills; Desire for Improvement; Housing; Social Support   Sleep  Sleep:Sleep: Fair Number of Hours of Sleep: 5.5    Physical Exam:  Blood pressure 119/83, pulse 87, temperature 98.3 F (36.8 C), temperature source Oral, resp. rate 16, height 5' 11 (1.803 m), weight 75.8 kg, SpO2 100%. Body mass index is 23.29 kg/m.   COGNITIVE FEATURES THAT CONTRIBUTE TO RISK:  Thought constriction (tunnel vision)    SUICIDE RISK:   Moderate:  Frequent suicidal ideation with limited intensity, and duration, some specificity in terms of plans, no associated intent, good self-control, limited dysphoria/symptomatology, some risk factors present, and identifiable protective factors, including available and accessible social support.  PLAN OF CARE: See H&P.  I certify that inpatient services furnished can reasonably be expected to improve the patient's condition.   Mac Bolster, NP, pmhnp, fnp-bc. 10/16/2023, 4:32 PM

## 2023-10-16 NOTE — Tx Team (Signed)
 Initial Treatment Plan 10/16/2023 5:43 PM Frank Stark [id number]    PATIENT STRESSORS: Other: denies     PATIENT STRENGTHS: Other: denies   PATIENT IDENTIFIED PROBLEMS:  AH and tactile hallucinations                     DISCHARGE CRITERIA:    PRELIMINARY DISCHARGE PLAN:   PATIENT/FAMILY INVOLVEMENT: This treatment plan has been presented to and reviewed with the patient, Frank Stark, and/or family member.  The patient and family have been given the opportunity to ask questions and make suggestions.  Frank JINNY Angle, RN 10/16/2023, 5:43 PM

## 2023-10-16 NOTE — Progress Notes (Signed)
 [name], RN, 10/16/23, Time of arrival: 1210  Patient is a new admit to unit. Patient is voluntary. Patient belongings addressed and stored. Skin check performed with two staff, results unremarkable. Vital signs unremarkable. No reported or observed physiological concerns or abnormalities. Patient engaged with assessment with encouragement. Patient orientated to facility, unit and room. All questions and concerns addressed at this time.  Patient stated reason for admission: reports AH for +1 month and tactile hallucinations-vibrations on left side of body  Patient presentation remarkable for: calm and congruent but guarded  Patient history remarkable for: prior hx of hallucinations

## 2023-10-16 NOTE — Group Note (Signed)
 Date:  10/16/2023 Time:  8:48 PM  Group Topic/Focus:  Wrap-Up Group:   The focus of this group is to help patients review their daily goal of treatment and discuss progress on daily workbooks.    Participation Level:  Did Not Attend  Participation Quality:  none  Affect:  none  Cognitive:  none  Insight: None  Engagement in Group:  None  Modes of Intervention:  none  Additional Comments:   Today is pts first day on the unit. Pt did not attend wrap up group   Jakell Trusty A Raelynne Ludwick 10/16/2023, 8:48 PM

## 2023-10-16 NOTE — Plan of Care (Signed)
   Problem: Education: Goal: Knowledge of [facility] Education information/materials will improve Outcome: Progressing Goal: Verbalization of understanding the information provided will improve Outcome: Progressing

## 2023-10-16 NOTE — Group Note (Signed)
 Date:  10/16/2023 Time:  4:38 PM  Group Topic/Focus:  Personal Choices and Values:   The focus of this group is to help patients assess and explore the importance of values in their lives, how their values affect their decisions, how they express their values and what opposes their expression. Recovery Goals:   The focus of this group is to identify appropriate goals for recovery and establish a plan to achieve them.    Participation Level:  Did Not Attend  Participation Quality:    Affect:    Cognitive:    Insight:   Engagement in Group:    Modes of Intervention:    Additional Comments:    Garnie Borchardt 10/16/2023, 4:38 PM

## 2023-10-17 NOTE — Group Note (Signed)
 Date:  10/17/2023 Time:  9:07 PM  Group Topic/Focus:  Wrap-Up Group:   The focus of this group is to help patients review their daily goal of treatment and discuss progress on daily workbooks.    Participation Level:  Active  Participation Quality:  Appropriate  Affect:  Appropriate  Cognitive:  Appropriate  Insight: Appropriate  Engagement in Group:  Engaged  Modes of Intervention:  Discussion  Additional Comments:  Pt states that he's proud that he was able to get up out of bed more today. Pt states that he's participated in programming. Pt endorsed hearing voices often and SI (nurse notified)  Jeidy Hoerner A Dazani Norby 10/17/2023, 9:07 PM

## 2023-10-17 NOTE — Progress Notes (Signed)
 [facility] MD Progress Note  10/17/2023 4:05 PM Frank Stark  MRN:  [id number]  Subjective:  43 year old male with history of HIV, anxiety, depression, presenting to the ED with hallucinations.  This has been ongoing for the past month.  He was previously on Prozac  and amitriptyline from PCP.  Once he saw psychiatry they took him off the amitriptyline as they thought maybe this was contributing to his hallucinations.  His dose of Seroquel  was increased but he has not noticed any improvement.  States hallucinations are becoming more frequent.  Notably he has hearing voices and has tactile hallucinations like vibrations.  He does report some command hallucinations.    Daily notes: Frank Stark is seen this morning. Chart reviewed. The chart findings discussed with the treatment team. He presents alert, oriented & aware of situation. He is visible on the unit, attending group sessions. He presents with an improving affect, good eye contact & verbally responsive. He reports,  I'm doing good. I feel good too. I think I'm ready for discharge. I'm still hearing the voices, but they are now low that I cannot make out what they are saying. I slept well last night. My appetite is good. Frank Stark currently denies any SIHI, VH, delusional thoughts or paranoia. He does not appear to be responding to any internal stimuli. This case was discussed at the team meeting today. If patient maintains this stability by tomorrow, may be discharged. Vital signs remain stable.  Principal Problem: Severe recurrent major depressive disorder with psychotic features (HCC) Diagnosis: Principal Problem:   Severe recurrent major depressive disorder with psychotic features (HCC) Active Problems:   Hallucinations  Total Time spent with patient: 45 minutes  Past Psychiatric History: See H&P.  Past Medical History:  Past Medical History:  Diagnosis Date   Anxiety    Depression    HIV (human immunodeficiency virus infection) (HCC)     History reviewed. No pertinent surgical history. Family History:  Family History  Problem Relation Age of Onset   Hypertension Mother    Hypertension Father    Asthma Sister    Diabetes Maternal Grandmother    Dementia Maternal Grandmother    Glaucoma Maternal Grandfather    Family Psychiatric  History: See H&P.  Social History:  Social History   Substance and Sexual Activity  Alcohol Use Not Currently   Comment: rarely     Social History   Substance and Sexual Activity  Drug Use Not Currently   Comment: occasional    Social History   Socioeconomic History   Marital status: Single    Spouse name: Not on file   Number of children: Not on file   Years of education: Not on file   Highest education level: Not on file  Occupational History   Not on file  Tobacco Use   Smoking status: Every Day    Current packs/day: 0.00    Types: Cigarettes   Smokeless tobacco: Never  Vaping Use   Vaping status: Every Day  Substance and Sexual Activity   Alcohol use: Not Currently    Comment: rarely   Drug use: Not Currently    Comment: occasional   Sexual activity: Yes    Partners: Male    Comment: pt given condoms  Other Topics Concern   Not on file  Social History Narrative   Not on file   Social Drivers of Health   Financial Resource Strain: Not on file  Food Insecurity: No Food Insecurity (10/16/2023)   Hunger Vital  Sign    Worried About Programme researcher, broadcasting/film/video in the Last Year: Never true    Ran Out of Food in the Last Year: Never true  Transportation Needs: No Transportation Needs (10/16/2023)   PRAPARE - Administrator, Civil Service (Medical): No    Lack of Transportation (Non-Medical): No  Physical Activity: Not on file  Stress: Not on file  Social Connections: Unknown (06/15/2021)   Received from [facility]   Social Network    Social Network: Not on file   Additional Social History:   Sleep: Good Estimated Sleeping Duration (Last 24 Hours):  6.50-8.50 hours  Appetite:  Good  Current Medications: Current Facility-Administered Medications  Medication Dose Route Frequency Provider Last Rate Last Admin   acetaminophen  (TYLENOL ) tablet 650 mg  650 mg Oral Q6H PRN Weber, Kyra A, NP       alum & mag hydroxide-simeth (MAALOX/MYLANTA) 200-200-20 MG/5ML suspension 30 mL  30 mL Oral Q4H PRN Weber, Kyra A, NP       bictegravir-emtricitabine -tenofovir  AF (BIKTARVY ) 50-200-25 MG per tablet 1 tablet  1 tablet Oral Daily Weber, Kyra A, NP   1 tablet at 10/17/23 9176   haloperidol  (HALDOL ) tablet 5 mg  5 mg Oral TID PRN Weber, Kyra A, NP   5 mg at 10/16/23 1533   And   diphenhydrAMINE  (BENADRYL ) capsule 50 mg  50 mg Oral TID PRN Weber, Kyra A, NP   50 mg at 10/16/23 1533   haloperidol  lactate (HALDOL ) injection 5 mg  5 mg Intramuscular TID PRN Weber, Kyra A, NP       And   diphenhydrAMINE  (BENADRYL ) injection 50 mg  50 mg Intramuscular TID PRN Weber, Kyra A, NP       And   LORazepam  (ATIVAN ) injection 2 mg  2 mg Intramuscular TID PRN Weber, Kyra A, NP       haloperidol  lactate (HALDOL ) injection 10 mg  10 mg Intramuscular TID PRN Weber, Kyra A, NP       And   diphenhydrAMINE  (BENADRYL ) injection 50 mg  50 mg Intramuscular TID PRN Weber, Kyra A, NP       And   LORazepam  (ATIVAN ) injection 2 mg  2 mg Intramuscular TID PRN Weber, Kyra A, NP       FLUoxetine  (PROZAC ) capsule 40 mg  40 mg Oral Daily Weber, Kyra A, NP   40 mg at 10/17/23 9177   gabapentin  (NEURONTIN ) capsule 300 mg  300 mg Oral Daily Weber, Kyra A, NP   300 mg at 10/17/23 9176   magnesium  hydroxide (MILK OF MAGNESIA) suspension 30 mL  30 mL Oral Daily PRN Weber, Kyra A, NP       naproxen  (NAPROSYN ) tablet 250 mg  250 mg Oral TID WC Weber, Kyra A, NP   250 mg at 10/17/23 1154   QUEtiapine  (SEROQUEL ) tablet 400 mg  400 mg Oral QHS Murrel Bertram, Mac I, NP   400 mg at 10/16/23 2127   traZODone  (DESYREL ) tablet 50 mg  50 mg Oral QHS Collene Mac I, NP   50 mg at 10/16/23 2127   Lab  Results:  Results for orders placed or performed during the hospital encounter of 10/15/23 (from the past 48 hours)  Comprehensive metabolic panel     Status: Abnormal   Collection Time: 10/15/23 11:58 PM  Result Value Ref Range   Sodium 140 135 - 145 mmol/L   Potassium 4.6 3.5 - 5.1 mmol/L   Chloride 102 98 -  111 mmol/L   CO2 26 22 - 32 mmol/L   Glucose, Bld 104 (H) 70 - 99 mg/dL    Comment: Glucose reference range applies only to samples taken after fasting for at least 8 hours.   BUN 17 6 - 20 mg/dL   Creatinine, Ser 8.84 0.61 - 1.24 mg/dL   Calcium 9.8 8.9 - 89.6 mg/dL   Total Protein 7.4 6.5 - 8.1 g/dL   Albumin 4.4 3.5 - 5.0 g/dL   AST 22 15 - 41 U/L   ALT 19 0 - 44 U/L   Alkaline Phosphatase 86 38 - 126 U/L   Total Bilirubin 0.3 0.0 - 1.2 mg/dL   GFR, Estimated >39 >39 mL/min    Comment: (NOTE) Calculated using the CKD-EPI Creatinine Equation (2021)    Anion gap 12 5 - 15    Comment: Performed at [facility], [address], [place], [place] [address]  cbc     Status: Abnormal   Collection Time: 10/15/23 11:58 PM  Result Value Ref Range   WBC 4.5 4.0 - 10.5 K/uL   RBC 3.72 (L) 4.22 - 5.81 MIL/uL   Hemoglobin 12.1 (L) 13.0 - 17.0 g/dL   HCT 63.0 (L) 60.9 - 47.9 %   MCV 99.2 80.0 - 100.0 fL   MCH 32.5 26.0 - 34.0 pg   MCHC 32.8 30.0 - 36.0 g/dL   RDW 86.7 88.4 - 84.4 %   Platelets 160 150 - 400 K/uL   nRBC 0.0 0.0 - 0.2 %    Comment: Performed at [facility], [address] [place], [place] [address]  Ethanol     Status: None   Collection Time: 10/15/23 11:59 PM  Result Value Ref Range   Alcohol, Ethyl (B) <15 <15 mg/dL    Comment: (NOTE) For medical purposes only. Performed at [facility], [address] [place], [place] [address]   Rapid urine drug screen (hospital performed)     Status: None   Collection Time: 10/15/23 11:59 PM  Result Value Ref Range   Opiates NEGATIVE NEGATIVE   Cocaine  NEGATIVE NEGATIVE   Benzodiazepines NEGATIVE NEGATIVE   Amphetamines NEGATIVE NEGATIVE   Tetrahydrocannabinol NEGATIVE NEGATIVE   Barbiturates NEGATIVE NEGATIVE   Methadone Scn, Ur NEGATIVE NEGATIVE   Fentanyl  NEGATIVE NEGATIVE    Comment: (NOTE) Drug screen is for Medical Purposes only. Positive results are preliminary only. If confirmation is needed, notify lab within 5 days.  Drug Class                 Cutoff (ng/mL) Amphetamine and metabolites 1000 Barbiturate and metabolites 200 Benzodiazepine              200 Opiates and metabolites     300 Cocaine and metabolites     300 THC                         50 Fentanyl                     5 Methadone                   300  Trazodone  is metabolized in vivo to several metabolites,  including pharmacologically active m-CPP, which is excreted in the  urine.  Immunoassay screens for amphetamines and MDMA have potential  cross-reactivity with these compounds and may provide false positive  result.  Performed at [facility], [address], [place], [place] [address]    Blood Alcohol level:  Lab Results  Component Value Date   [facility] <15 10/15/2023   Metabolic Disorder Labs: No results found for: HGBA1C, MPG No results found for: PROLACTIN Lab Results  Component Value Date   CHOL 121 02/26/2012   TRIG 72 02/26/2012   HDL 36 (L) 02/26/2012   CHOLHDL 3.4 02/26/2012   VLDL 14 02/26/2012   LDLCALC 71 02/26/2012   Physical Findings: AIMS:  ,  ,  ,  ,  ,  ,   CIWA:    COWS:     Musculoskeletal: Strength & Muscle Tone: within normal limits Gait & Station: normal Patient leans: N/A  Psychiatric Specialty Exam:  Presentation  General Appearance:  Casual; Fairly Groomed  Eye Contact: Fair  Speech: Clear and Coherent; Normal Rate  Speech Volume: Normal  Handedness: Right   Mood and Affect  Mood: Depressed; Anxious  Affect: Non-Congruent   Thought Process  Thought  Processes: Coherent; Goal Directed; Linear  Descriptions of Associations:Intact  Orientation:Full (Time, Place and Person)  Thought Content:Logical  History of Schizophrenia/Schizoaffective disorder:No  Duration of Psychotic Symptoms:N/A  Hallucinations:Hallucinations: Auditory Description of Auditory Hallucinations: Come out.  Ideas of Reference:None  Suicidal Thoughts:Suicidal Thoughts: No  Homicidal Thoughts:Homicidal Thoughts: No   Sensorium  Memory: Immediate Good; Recent Good; Remote Good  Judgment: Fair  Insight: Fair   Art therapist  Concentration: Good  Attention Span: Good  Recall: Good  Fund of Knowledge: Fair  Language: Good  Psychomotor Activity  Psychomotor Activity: Psychomotor Activity: Normal  Assets  Assets: Communication Skills; Desire for Improvement; Housing; Social Support  Sleep  Sleep: Sleep: Fair Number of Hours of Sleep: 5.5  Physical Exam: Physical Exam Vitals and nursing note reviewed.  HENT:     Head: Normocephalic.     Nose: Nose normal.     Mouth/Throat:     Pharynx: Oropharynx is clear.  Cardiovascular:     Rate and Rhythm: Normal rate.     Pulses: Normal pulses.  Pulmonary:     Effort: Pulmonary effort is normal.  Genitourinary:    Comments: Deferred. Musculoskeletal:        General: Normal range of motion.     Cervical back: Normal range of motion.  Skin:    General: Skin is dry.  Neurological:     General: No focal deficit present.     Mental Status: He is alert and oriented to person, place, and time.    Review of Systems  Constitutional:  Negative for chills and fever.  HENT:  Negative for congestion and sore throat.   Respiratory:  Negative for cough, shortness of breath and wheezing.   Cardiovascular:  Negative for chest pain and palpitations.  Gastrointestinal:  Negative for abdominal pain, constipation, diarrhea, heartburn, nausea and vomiting.  Genitourinary:  Negative for  dysuria.  Musculoskeletal:  Negative for joint pain and myalgias.  Neurological:  Negative for dizziness, tingling, tremors, sensory change, speech change, focal weakness, seizures, loss of consciousness, weakness and headaches.  Endo/Heme/Allergies:        See list.  Psychiatric/Behavioral:  Positive for depression, hallucinations and substance abuse. Negative for memory loss and suicidal ideas. The patient is not nervous/anxious and does not have insomnia.    Blood pressure 104/72, pulse 82, temperature 98.2 F (36.8 C), resp. rate 16, height 5' 11 (1.803 m), weight 75.8 kg, SpO2 96%. Body mass index is 23.29 kg/m.  Treatment Plan Summary: Daily contact with  patient to assess and evaluate symptoms and progress in treatment and Medication management.   Principal/active diagnoses.  Severe recurrent major depressive disorder with psychotic features (HCC)  Plan: The risks/benefits/side-effects/alternatives to the medications in use were discussed in detail with the patient and time was given for patient's questions. The patient consents to medication trial.    -Continue Seroquel  400 mg po Q bedtime for sleep.  -Continue Fluoxetine  40 mg po daily for depression.  -Continue gabapentin  300 mg po for nerve pain.  -Continue hydroxyzine 25 mg po tid prn for anxiety.  -Continue Trazodone  50 mg po Q hs for insomnia.    Agitation protocols.  -Continue as recommended (see MAR).   Medical.  -Continue Biktarvy  50-200-25 po daily for HIV infection.   Other PRNS -Continue Tylenol  650 mg every 6 hours PRN for mild pain -Continue Maalox 30 ml Q 4 hrs PRN for indigestion -Continue MOM 30 ml po Q 6 hrs for constipation   Safety and Monitoring: Voluntary admission to inpatient psychiatric unit for safety, stabilization and treatment Daily contact with patient to assess and evaluate symptoms and progress in treatment Patient's case to be discussed in multi-disciplinary team meeting Observation  Level : q15 minute checks Vital signs: q12 hours Precautions: Safety   Discharge Planning: Social work and case management to assist with discharge planning and identification of hospital follow-up needs prior to discharge Estimated LOS: 5-7 days Discharge Concerns: Need to establish a safety plan; Medication compliance and effectiveness Discharge Goals: Return home with outpatient referrals for mental health follow-up including medication management/psychotherapy  Mac Bolster, NP, pmhnp, fnp-bc. 10/17/2023, 4:05 PM

## 2023-10-17 NOTE — Group Note (Signed)
 LCSW Group Therapy Note   Group Date: 10/17/2023 Start Time: 1100 End Time: 1200   Participation:  patient was present and actively participated in the conversation  Type of Therapy:  Group Therapy  Topic:  Lifestyle:  from "One Day" to "Today is Day One"  Objective:  To promote mental and physical well-being through lifestyle changes in routine, nutrition, sleep, and movement.  Goals: Increase awareness of how lifestyle habits impact mental health. Encourage one small, achievable wellness goal. Support group sharing and accountability.  Summary:  Group members explored how daily habits influence mental health and discussed the importance of starting with small, manageable changes. Participants identified personal goals and shared reflections on improving structure, sleep, diet, and physical activity.  Therapeutic Modalities: CBT - Identifying and challenging all-or-nothing thinking; promoting realistic, helpful thoughts about change. Psychoeducation - Teaching about the impact of sleep, nutrition, movement, and routine on mental health. Motivational Interviewing - Eliciting personal motivation and exploring readiness for change. Goal-Setting - Supporting SMART goals to build self-efficacy and encourage follow-through.   Bronwen Pendergraft O Jenna Ardoin, LCSWA 10/17/2023  12:33 PM

## 2023-10-17 NOTE — Plan of Care (Signed)
   Problem: Education: Goal: Emotional status will improve Outcome: Progressing Goal: Mental status will improve Outcome: Progressing Goal: Verbalization of understanding the information provided will improve Outcome: Progressing

## 2023-10-17 NOTE — Progress Notes (Addendum)
(  Sleep Hours) - 8  (Any PRNs that were needed, meds refused, or side effects to meds)-   None  (Any disturbances and when (visitation, over night)- None  (Concerns raised by the patient)- None  (SI/HI/AVH)-  A/and Tacticle

## 2023-10-17 NOTE — Plan of Care (Signed)
   Problem: Education: Goal: Knowledge of [facility] Education information/materials will improve Outcome: Progressing Goal: Emotional status will improve Outcome: Progressing Goal: Mental status will improve Outcome: Progressing

## 2023-10-17 NOTE — Progress Notes (Signed)
   10/17/23 0823  Psych Admission Type (Psych Patients Only)  Admission Status Voluntary  Psychosocial Assessment  Patient Complaints Anxiety  Eye Contact Fair  Facial Expression Anxious  Affect Anxious  Speech Logical/coherent  Interaction Cautious  Motor Activity Other (Comment) (WDL)  Appearance/Hygiene Unremarkable  Behavior Characteristics Cooperative;Appropriate to situation  Mood Anxious  Thought Process  Coherency Disorganized  Content Delusions  Delusions Paranoid;Persecutory  Perception Hallucinations  Hallucination Tactile;Auditory  Judgment Impaired  Confusion None  Danger to Self  Current suicidal ideation? Denies  Agreement Not to Harm Self Yes  Description of Agreement Verbal

## 2023-10-17 NOTE — [facility] Suicide Risk Assessment (Signed)
 [facility] INPATIENT:  Family/Significant Other Suicide Prevention Education  Suicide Prevention Education:  Education Completed; Jadore Veals (mom) [phone], (name of family member/significant other) has been identified by the patient as the family member/significant other with whom the patient will be residing, and identified as the person(s) who will aid the patient in the event of a mental health crisis (suicidal ideations/suicide attempt).  With written consent from the patient, the family member/significant other has been provided the following suicide prevention education, prior to the and/or following the discharge of the patient.  Mom said that she will pick up patient tomorrow (Friday, 9/12) at 1 PM.  Mom said they have guns but they are locked up and patient doesn't have access to them.  CSW emailed mom the resources to [email or web address]  The suicide prevention education provided includes the following: Suicide risk factors Suicide prevention and interventions National Suicide Hotline telephone number [facility] assessment telephone number [facility] Emergency Assistance 911 [facility] and/or Residential Mobile Crisis Unit telephone number  Request made of family/significant other to: Remove weapons (e.g., guns, rifles, knives), all items previously/currently identified as safety concern.   Remove drugs/medications (over-the-counter, prescriptions, illicit drugs), all items previously/currently identified as a safety concern.  The family member/significant other verbalizes understanding of the suicide prevention education information provided.  The family member/significant other agrees to remove the items of safety concern listed above.  Arrin Ishler O Mersadez Linden, LCSWA 10/17/2023, 4:37 PM

## 2023-10-17 NOTE — Plan of Care (Signed)
   Problem: Safety: Goal: Periods of time without injury will increase Outcome: Progressing
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
## 2023-10-17 NOTE — Group Note (Signed)
 Date:  10/17/2023 Time:  6:22 PM  Group Topic/Focus:  Coping With Mental Health Crisis:   The purpose of this group is to help patients identify strategies for coping with mental health crisis.  Group discusses possible causes of crisis and ways to manage them effectively. Developing a Wellness Toolbox:   The focus of this group is to help patients develop a wellness toolbox with skills and strategies to promote recovery upon discharge. Wellness Toolbox:   The focus of this group is to discuss various aspects of wellness, balancing those aspects and exploring ways to increase the ability to experience wellness.  Patients will create a wellness toolbox for use upon discharge.    Participation Level:  Did Not Attend  Participation Quality:    Affect:    Cognitive:    Insight:   Engagement in Group:    Modes of Intervention:    Additional Comments:    Frank Stark 10/17/2023, 6:22 PM

## 2023-10-18 ENCOUNTER — Encounter (HOSPITAL_COMMUNITY): Payer: Self-pay

## 2023-10-18 MED ORDER — ALBUTEROL SULFATE HFA 108 (90 BASE) MCG/ACT IN AERS
1.0000 | INHALATION_SPRAY | Freq: Four times a day (QID) | RESPIRATORY_TRACT | Status: DC | PRN
Start: 2023-10-18 — End: 2023-10-23
  Administered 2023-10-18 – 2023-10-19 (×2): 2 via RESPIRATORY_TRACT
  Filled 2023-10-18: qty 6.7

## 2023-10-18 MED ORDER — NICOTINE POLACRILEX 2 MG MT GUM
2.0000 mg | CHEWING_GUM | OROMUCOSAL | Status: DC | PRN
  Administered 2023-10-18 – 2023-10-22 (×12): 2 mg via ORAL
  Filled 2023-10-18 (×10): qty 1

## 2023-10-18 MED ORDER — NICOTINE 14 MG/24HR TD PT24: 14.0000 mg | MEDICATED_PATCH | Freq: Every day | TRANSDERMAL | Status: DC

## 2023-10-18 MED ORDER — FLUOXETINE HCL 40 MG PO CAPS: 40.0000 mg | ORAL_CAPSULE | Freq: Every day | ORAL | 0 refills | Status: DC

## 2023-10-18 MED ORDER — TRAZODONE HCL 50 MG PO TABS: 50.0000 mg | ORAL_TABLET | Freq: Every day | ORAL | 0 refills | Status: AC

## 2023-10-18 MED ORDER — GABAPENTIN 300 MG PO CAPS: 300.0000 mg | ORAL_CAPSULE | Freq: Every day | ORAL | 0 refills | Status: DC

## 2023-10-18 MED ORDER — QUETIAPINE FUMARATE 400 MG PO TABS: 400.0000 mg | ORAL_TABLET | Freq: Every day | ORAL | 0 refills | Status: DC

## 2023-10-18 MED ORDER — NICOTINE 14 MG/24HR TD PT24
14.0000 mg | MEDICATED_PATCH | Freq: Every day | TRANSDERMAL | Status: DC
Start: 2023-10-18 — End: 2023-10-21
  Administered 2023-10-18 – 2023-10-21 (×4): 14 mg via TRANSDERMAL
  Filled 2023-10-18 (×4): qty 1

## 2023-10-18 MED ORDER — BENZOCAINE 10 % MT GEL
Freq: Two times a day (BID) | OROMUCOSAL | Status: DC | PRN
  Filled 2023-10-18: qty 9

## 2023-10-18 MED ORDER — TRAZODONE HCL 100 MG PO TABS
100.0000 mg | ORAL_TABLET | Freq: Every day | ORAL | Status: DC
  Administered 2023-10-18: 100 mg via ORAL
  Filled 2023-10-18 (×2): qty 1

## 2023-10-18 NOTE — Progress Notes (Signed)
   10/18/23 1000  Psych Admission Type (Psych Patients Only)  Admission Status Voluntary  Psychosocial Assessment  Patient Complaints Anxiety  Eye Contact Fair  Facial Expression Anxious  Affect Anxious  Speech Logical/coherent  Interaction Cautious  Motor Activity Slow  Appearance/Hygiene Unremarkable  Behavior Characteristics Cooperative  Mood Anxious  Thought Process  Coherency Disorganized  Content Delusions  Delusions Paranoid  Perception Hallucinations  Hallucination Auditory;Tactile  Judgment Impaired  Confusion None  Danger to Self  Current suicidal ideation? Passive  Description of Suicide Plan No Plan  Agreement Not to Harm Self Yes  Description of Agreement Verbal  Danger to Others  Danger to Others Reported or observed

## 2023-10-18 NOTE — Group Note (Signed)
 Recreation Therapy Group Note   Group Topic:Leisure Education  Group Date: 10/18/2023 Start Time: 0935 End Time: 1011 Facilitators: Andreus Cure-McCall, LRT,CTRS Location: [address] Dayroom   Group Topic: Leisure Education   Goal Area(s) Addresses:  Patient will successfully demonstrate knowledge of leisure and recreation interests. Patient will successfully identify benefits of leisure participation.  Patient will verbalize appropriate recreation activities to use post discharge.   Behavioral Response:    Intervention: Guess the Lyric   Activity: LRT facilitated a competitive group game that had patients guess the missing lyric to songs presented. Patients had 6 categories (Pop, Rock, R&B, Dance, Indie and Hip Hop) to choose from. Patient would spin the flicker and whatever category the spinner landed on, the patient would be read a line from that song. If they had the correct answer, they kept the card. If they made the wrong answer, everyone else got a chance to steal the point. The person with the most cards at the end, was the winner.   Education:  Leisure Education, Publishing copy Outcome: Acknowledges education   Affect/Mood: N/A   Participation Level: Did not attend    Clinical Observations/Individualized Feedback:      Plan: Continue to engage patient in RT group sessions 2-3x/week.   Jader Desai-McCall, LRT,CTRS 10/18/2023 1:03 PM

## 2023-10-18 NOTE — Progress Notes (Deleted)
 Note:  Patient was scheduled to leave today, 9/12, however the discharge date has been rescheduled to tomorrow, 9/13  [facility] Adult Case Management Discharge Plan :  Will you be returning to the same living situation after discharge:  Yes,  patient lives with his parents At discharge, do you have transportation home?: Yes,  patient's mom, Ren Aspinall, [phone] will pick him up at 1 PM Do you have the ability to pay for your medications: Yes,  patient has insurance  Release of information consent forms completed and in the chart;  Patient's signature needed at discharge.  Patient to Follow up at:  Follow-up Information     [facility] Go on 10/29/2023.   Why: You have an appointment for medication management services on  10/29/23 at 9:00 am.   You may also schedule an appointment for therapy services. Contact information: [address] [place] [address] [phone]         Joesph Gunner, Counselor. Go on 10/22/2023.   Why: You have an appointment for therapy services on 10/22/23 at 11:00 am, in person. Contact information: [facility], [address]th floor [address], [place], [place] [address]  P: [phone], and [phone]                Next level of care provider has access to [facility] Link:no  Safety Planning and Suicide Prevention discussed: Yes,  Kastiel Simonian (mom) [phone]  Has patient been referred to the Quitline?:  Yes, a referral has been submitted to Quitline.  CSW also gave patient a print-out [employer]).  Patient has been referred for addiction treatment:  At admission, patient tested negative for all substances.  Patient admitted to using CBD gummies occasionally.  It helps me to relax and go to sleep.   Amelie Caracci O Reilly Blades, LCSWA 10/18/2023, 8:38 AM

## 2023-10-18 NOTE — Group Note (Signed)
 Date:  10/18/2023 Time:  4:37 PM  Group Topic/Focus:  Overcoming Stress:   The focus of this group is to define stress and help patients assess their triggers.    Participation Level:  Did Not Attend   Frank Stark 10/18/2023, 4:37 PM

## 2023-10-18 NOTE — Progress Notes (Signed)
 Frank Stark Progress Note  10/18/2023 11:10 AM Frank Stark  MRN:  [id number]  Subjective:  43 year old male with history of HIV, anxiety, depression, presenting to the ED with hallucinations.  This has been ongoing for the past month.  He was previously on Prozac  and amitriptyline from PCP.  Once he saw psychiatry they took him off the amitriptyline as they thought maybe this was contributing to his hallucinations.  His dose of Seroquel  was increased but he has not noticed any improvement.  States hallucinations are becoming more frequent.  Notably he has hearing voices and has tactile hallucinations like vibrations.  He does report some command hallucinations.    Daily notes: Frank Stark is seen this morning. Chart reviewed. The chart findings discussed with the treatment team. He presents alert, oriented & aware of situation. He is visible on the unit, attending group sessions. Frank Stark was scheduled for discharge today. However, he later informed the SW that he needed to stay one more day & will be ready for discharge tomorrow. When approached by this provider, Frank Stark reports, I think I need to stay one more day here. It just hit me few minutes ago that I'm not doing as good as I thought I was doing. I woke up this morning feeling okay. Then, all of a sudden, I started to have suicidal ideation. I'm not sure where this is coming from, may be from the past trauma of my childhood. I have gone through therapy sessions for it in the past, so I don't like to bring it up or talk about it any more. I think staying one more more day here will do me a lot of good. Besides, I did not sleep very well last night. I tossed & turned all night long. Frank Stark currently is endorsing suicidal ideation that started upon waking up this morning. He denies any plans or intent to hurt himself. He currently denies any HI, VH, delusional thoughts or paranoia. He does not appear to be responding to any internal stimuli. He is still  endorsing auditory hallucinations that he described as faint. He says he is unable to make out what the voices are saying. His discharge set for today has been cancelled. May discharge patient tomorrow if mood is stable. Vital signs remain stable. The attending Stark & SW are aware that patient's discharge for today is discontinued. His Trazodone  is increased from 50 mg to 100 mg Q hs to improve his sleep. Continue current plan of care as already in progress. Vital signs remain, stable.   Principal Problem: Severe recurrent major depressive disorder with psychotic features (HCC) Diagnosis: Principal Problem:   Severe recurrent major depressive disorder with psychotic features (HCC) Active Problems:   Hallucinations  Total Time spent with patient: 45 minutes  Past Psychiatric History: See H&P.  Past Medical History:  Past Medical History:  Diagnosis Date   Anxiety    Depression    HIV (human immunodeficiency virus infection) (HCC)    History reviewed. No pertinent surgical history. Family History:  Family History  Problem Relation Age of Onset   Hypertension Mother    Hypertension Father    Asthma Sister    Diabetes Maternal Grandmother    Dementia Maternal Grandmother    Glaucoma Maternal Grandfather    Family Psychiatric  History: See H&P.  Social History:  Social History   Substance and Sexual Activity  Alcohol Use Not Currently   Comment: rarely     Social History   Substance and Sexual  Activity  Drug Use Not Currently   Comment: occasional    Social History   Socioeconomic History   Marital status: Single    Spouse name: Not on file   Number of children: Not on file   Years of education: Not on file   Highest education level: Not on file  Occupational History   Not on file  Tobacco Use   Smoking status: Every Day    Current packs/day: 0.00    Types: Cigarettes   Smokeless tobacco: Never  Vaping Use   Vaping status: Every Day  Substance and Sexual Activity    Alcohol use: Not Currently    Comment: rarely   Drug use: Not Currently    Comment: occasional   Sexual activity: Yes    Partners: Male    Comment: pt given condoms  Other Topics Concern   Not on file  Social History Narrative   Not on file   Social Drivers of Health   Financial Resource Strain: Not on file  Food Insecurity: No Food Insecurity (10/16/2023)   Hunger Vital Sign    Worried About Running Out of Food in the Last Year: Never true    Ran Out of Food in the Last Year: Never true  Transportation Needs: No Transportation Needs (10/16/2023)   PRAPARE - Administrator, Civil Service (Medical): No    Lack of Transportation (Non-Medical): No  Physical Activity: Not on file  Stress: Not on file  Social Connections: Unknown (06/15/2021)   Received from [facility]   Social Network    Social Network: Not on file   Additional Social History:   Sleep: Good Estimated Sleeping Duration (Last 24 Hours): 6.25-8.00 hours  Appetite:  Good  Current Medications: Current Facility-Administered Medications  Medication Dose Route Frequency Provider Last Rate Last Admin   acetaminophen  (TYLENOL ) tablet 650 mg  650 mg Oral Q6H PRN Weber, Kyra A, NP       alum & mag hydroxide-simeth (MAALOX/MYLANTA) 200-200-20 MG/5ML suspension 30 mL  30 mL Oral Q4H PRN Weber, Kyra A, NP       bictegravir-emtricitabine -tenofovir  AF (BIKTARVY ) 50-200-25 MG per tablet 1 tablet  1 tablet Oral Daily Weber, Kyra A, NP   1 tablet at 10/18/23 0809   haloperidol  (HALDOL ) tablet 5 mg  5 mg Oral TID PRN Weber, Kyra A, NP   5 mg at 10/16/23 1533   And   diphenhydrAMINE  (BENADRYL ) capsule 50 mg  50 mg Oral TID PRN Weber, Kyra A, NP   50 mg at 10/16/23 1533   haloperidol  lactate (HALDOL ) injection 5 mg  5 mg Intramuscular TID PRN Weber, Kyra A, NP       And   diphenhydrAMINE  (BENADRYL ) injection 50 mg  50 mg Intramuscular TID PRN Weber, Kyra A, NP       And   LORazepam  (ATIVAN ) injection 2 mg  2 mg  Intramuscular TID PRN Weber, Kyra A, NP       haloperidol  lactate (HALDOL ) injection 10 mg  10 mg Intramuscular TID PRN Weber, Kyra A, NP       And   diphenhydrAMINE  (BENADRYL ) injection 50 mg  50 mg Intramuscular TID PRN Weber, Kyra A, NP       And   LORazepam  (ATIVAN ) injection 2 mg  2 mg Intramuscular TID PRN Weber, Kyra A, NP       FLUoxetine  (PROZAC ) capsule 40 mg  40 mg Oral Daily Weber, Kyra A, NP   40 mg  at 10/18/23 0809   gabapentin  (NEURONTIN ) capsule 300 mg  300 mg Oral Daily Weber, Kyra A, NP   300 mg at 10/18/23 0809   magnesium  hydroxide (MILK OF MAGNESIA) suspension 30 mL  30 mL Oral Daily PRN Weber, Kyra A, NP       naproxen  (NAPROSYN ) tablet 250 mg  250 mg Oral TID WC Weber, Kyra A, NP   250 mg at 10/18/23 0809   nicotine  (NICODERM CQ  - dosed in mg/24 hours) patch 14 mg  14 mg Transdermal Daily Parker, Alvin S, Stark   14 mg at 10/18/23 0809   QUEtiapine  (SEROQUEL ) tablet 400 mg  400 mg Oral QHS Collene Gouge I, NP   400 mg at 10/17/23 2105   traZODone  (DESYREL ) tablet 50 mg  50 mg Oral QHS Collene Gouge I, NP   50 mg at 10/17/23 2105   Lab Results:  No results found for this or any previous visit (from the past 48 hours).  Blood Alcohol level:  Lab Results  Component Value Date   [facility] <15 10/15/2023   Metabolic Disorder Labs: No results found for: HGBA1C, MPG No results found for: PROLACTIN Lab Results  Component Value Date   CHOL 121 02/26/2012   TRIG 72 02/26/2012   HDL 36 (L) 02/26/2012   CHOLHDL 3.4 02/26/2012   VLDL 14 02/26/2012   LDLCALC 71 02/26/2012   Physical Findings: AIMS:  ,  ,  ,  ,  ,  ,   CIWA:    COWS:     Musculoskeletal: Strength & Muscle Tone: within normal limits Gait & Station: normal Patient leans: N/A  Psychiatric Specialty Exam:  Presentation  General Appearance:  Appropriate for Environment; Casual  Eye Contact: Good  Speech: Clear and Coherent; Normal Rate  Speech Volume: Normal  Handedness: Right   Mood and  Affect  Mood: Euthymic  Affect: Congruent   Thought Process  Thought Processes: Coherent; Linear  Descriptions of Associations:Intact  Orientation:Full (Time, Place and Person)  Thought Content:Logical  History of Schizophrenia/Schizoaffective disorder:No  Duration of Psychotic Symptoms:N/A  Hallucinations:Hallucinations: -- (Faint auditory hallucinations.) Description of Auditory Hallucinations: I'm unable to make out what the voices are saying.  Ideas of Reference:None  Suicidal Thoughts:Suicidal Thoughts: No  Homicidal Thoughts:Homicidal Thoughts: No   Sensorium  Memory: Immediate Good; Recent Good; Remote Good  Judgment: Good  Insight: Fair   Executive Functions  Concentration: Good  Attention Span: Good  Recall: Good  Fund of Knowledge: Good  Language: Good  Psychomotor Activity  Psychomotor Activity: Psychomotor Activity: Normal  Assets  Assets: Communication Skills; Desire for Improvement; Housing; Social Support  Sleep  Sleep: Sleep: Good Number of Hours of Sleep: 8.5  Physical Exam: Physical Exam Vitals and nursing note reviewed.  HENT:     Head: Normocephalic.     Nose: Nose normal.     Mouth/Throat:     Pharynx: Oropharynx is clear.  Cardiovascular:     Rate and Rhythm: Normal rate.     Pulses: Normal pulses.  Pulmonary:     Effort: Pulmonary effort is normal.  Genitourinary:    Comments: Deferred. Musculoskeletal:        General: Normal range of motion.     Cervical back: Normal range of motion.  Skin:    General: Skin is dry.  Neurological:     General: No focal deficit present.     Mental Status: He is alert and oriented to person, place, and time.    Review of  Systems  Constitutional:  Negative for chills and fever.  HENT:  Negative for congestion and sore throat.   Respiratory:  Negative for cough, shortness of breath and wheezing.   Cardiovascular:  Negative for chest pain and palpitations.   Gastrointestinal:  Negative for abdominal pain, constipation, diarrhea, heartburn, nausea and vomiting.  Genitourinary:  Negative for dysuria.  Musculoskeletal:  Negative for joint pain and myalgias.  Neurological:  Negative for dizziness, tingling, tremors, sensory change, speech change, focal weakness, seizures, loss of consciousness, weakness and headaches.  Endo/Heme/Allergies:        See list.  Psychiatric/Behavioral:  Positive for depression, hallucinations and substance abuse. Negative for memory loss and suicidal ideas. The patient is not nervous/anxious and does not have insomnia.    Blood pressure 103/69, pulse 78, temperature 98 F (36.7 C), resp. rate 16, height 5' 11 (1.803 m), weight 75.8 kg, SpO2 98%. Body mass index is 23.29 kg/m.  Treatment Plan Summary: Daily contact with patient to assess and evaluate symptoms and progress in treatment and Medication management.   Principal/active diagnoses.  Severe recurrent major depressive disorder with psychotic features (HCC)  Plan: The risks/benefits/side-effects/alternatives to the medications in use were discussed in detail with the patient and time was given for patient's questions. The patient consents to medication trial.    -Continue Seroquel  400 mg po Q bedtime for sleep.  -Continue Fluoxetine  40 mg po daily for depression.  -Continue gabapentin  300 mg po for nerve pain.  -Continue hydroxyzine 25 mg po tid prn for anxiety.  -Increased Trazodone  from 50 mg to 100 mg po Q hs for insomnia.    Agitation protocols.  -Continue as recommended (see MAR).   Medical.  -Continue Biktarvy  50-200-25 po daily for HIV infection.   Other PRNS -Continue Tylenol  650 mg every 6 hours PRN for mild pain -Continue Maalox 30 ml Q 4 hrs PRN for indigestion -Continue MOM 30 ml po Q 6 hrs for constipation   Safety and Monitoring: Voluntary admission to inpatient psychiatric unit for safety, stabilization and treatment Daily contact  with patient to assess and evaluate symptoms and progress in treatment Patient's case to be discussed in multi-disciplinary team meeting Observation Level : q15 minute checks Vital signs: q12 hours Precautions: Safety   Discharge Planning: Social work and case management to assist with discharge planning and identification of hospital follow-up needs prior to discharge Estimated LOS: 5-7 days Discharge Concerns: Need to establish a safety plan; Medication compliance and effectiveness Discharge Goals: Return home with outpatient referrals for mental health follow-up including medication management/psychotherapy  Frank Bolster, NP, pmhnp, fnp-bc. 10/18/2023, 11:10 AM Patient ID: Frank Stark, male   DOB: August 07, 1980, 43 y.o.   MRN: [id number]

## 2023-10-18 NOTE — BH IP Treatment Plan (Signed)
 Interdisciplinary Treatment and Diagnostic Plan Update  10/18/2023 Time of Session: 10:50 AM Frank Stark MRN: [id number]  Principal Diagnosis: Severe recurrent major depressive disorder with psychotic features [facility])  Secondary Diagnoses: Principal Problem:   Severe recurrent major depressive disorder with psychotic features (HCC) Active Problems:   Hallucinations   Current Medications:  Current Facility-Administered Medications  Medication Dose Route Frequency Provider Last Rate Last Admin   acetaminophen  (TYLENOL ) tablet 650 mg  650 mg Oral Q6H PRN Weber, Kyra A, NP       alum & mag hydroxide-simeth (MAALOX/MYLANTA) 200-200-20 MG/5ML suspension 30 mL  30 mL Oral Q4H PRN Weber, Kyra A, NP       bictegravir-emtricitabine -tenofovir  AF (BIKTARVY ) 50-200-25 MG per tablet 1 tablet  1 tablet Oral Daily Weber, Kyra A, NP   1 tablet at 10/18/23 0809   haloperidol  (HALDOL ) tablet 5 mg  5 mg Oral TID PRN Weber, Kyra A, NP   5 mg at 10/16/23 1533   And   diphenhydrAMINE  (BENADRYL ) capsule 50 mg  50 mg Oral TID PRN Weber, Kyra A, NP   50 mg at 10/16/23 1533   haloperidol  lactate (HALDOL ) injection 5 mg  5 mg Intramuscular TID PRN Weber, Kyra A, NP       And   diphenhydrAMINE  (BENADRYL ) injection 50 mg  50 mg Intramuscular TID PRN Weber, Kyra A, NP       And   LORazepam  (ATIVAN ) injection 2 mg  2 mg Intramuscular TID PRN Weber, Kyra A, NP       haloperidol  lactate (HALDOL ) injection 10 mg  10 mg Intramuscular TID PRN Weber, Kyra A, NP       And   diphenhydrAMINE  (BENADRYL ) injection 50 mg  50 mg Intramuscular TID PRN Weber, Kyra A, NP       And   LORazepam  (ATIVAN ) injection 2 mg  2 mg Intramuscular TID PRN Weber, Kyra A, NP       FLUoxetine  (PROZAC ) capsule 40 mg  40 mg Oral Daily Weber, Kyra A, NP   40 mg at 10/18/23 0809   gabapentin  (NEURONTIN ) capsule 300 mg  300 mg Oral Daily Weber, Kyra A, NP   300 mg at 10/18/23 0809   magnesium  hydroxide (MILK OF MAGNESIA) suspension 30 mL  30 mL  Oral Daily PRN Weber, Kyra A, NP       naproxen  (NAPROSYN ) tablet 250 mg  250 mg Oral TID WC Weber, Kyra A, NP   250 mg at 10/18/23 1243   nicotine  (NICODERM CQ  - dosed in mg/24 hours) patch 14 mg  14 mg Transdermal Daily Parker, Alvin S, MD   14 mg at 10/18/23 0809   QUEtiapine  (SEROQUEL ) tablet 400 mg  400 mg Oral QHS Nwoko, Agnes I, NP   400 mg at 10/17/23 2105   traZODone  (DESYREL ) tablet 100 mg  100 mg Oral QHS Nwoko, Mac I, NP       PTA Medications: Medications Prior to Admission  Medication Sig Dispense Refill Last Dose/Taking   acetaminophen  (TYLENOL ) 325 MG tablet Take 2 tablets (650 mg total) by mouth every 6 (six) hours as needed. 36 tablet 0    albuterol  (VENTOLIN  HFA) 108 (90 Base) MCG/ACT inhaler Inhale 1-2 puffs into the lungs every 6 (six) hours as needed for wheezing or shortness of breath. 1 each 0    BIKTARVY  50-200-25 MG TABS tablet Take 1 tablet by mouth daily.      guaiFENesin -codeine  100-10 MG/5ML syrup Take 5 mLs by mouth  3 (three) times daily as needed for cough. 120 mL 0    meloxicam  (MOBIC ) 7.5 MG tablet Take 7.5 mg by mouth daily.      [DISCONTINUED] FLUoxetine  (PROZAC ) 40 MG capsule Take 40 mg by mouth daily.      [DISCONTINUED] gabapentin  (NEURONTIN ) 300 MG capsule Take 300 mg by mouth daily.      [DISCONTINUED] QUEtiapine  (SEROQUEL ) 400 MG tablet Take 400 mg by mouth at bedtime.       Patient Stressors: Other: denies    Patient Strengths: Other: denies  Treatment Modalities: Medication Management, Group therapy, Case management,  1 to 1 session with clinician, Psychoeducation, Recreational therapy.   Physician Treatment Plan for Primary Diagnosis: Severe recurrent major depressive disorder with psychotic features (HCC) Long Term Goal(s): Improvement in symptoms so as ready for discharge   Short Term Goals: Ability to identify and develop effective coping behaviors will improve Ability to maintain clinical measurements within normal limits will  improve Compliance with prescribed medications will improve Ability to identify triggers associated with substance abuse/mental health issues will improve Ability to identify changes in lifestyle to reduce recurrence of condition will improve Ability to verbalize feelings will improve Ability to disclose and discuss suicidal ideas Ability to demonstrate self-control will improve  Medication Management: Evaluate patient's response, side effects, and tolerance of medication regimen.  Therapeutic Interventions: 1 to 1 sessions, Unit Group sessions and Medication administration.  Evaluation of Outcomes: Not Progressing  Physician Treatment Plan for Secondary Diagnosis: Principal Problem:   Severe recurrent major depressive disorder with psychotic features (HCC) Active Problems:   Hallucinations  Long Term Goal(s): Improvement in symptoms so as ready for discharge   Short Term Goals: Ability to identify and develop effective coping behaviors will improve Ability to maintain clinical measurements within normal limits will improve Compliance with prescribed medications will improve Ability to identify triggers associated with substance abuse/mental health issues will improve Ability to identify changes in lifestyle to reduce recurrence of condition will improve Ability to verbalize feelings will improve Ability to disclose and discuss suicidal ideas Ability to demonstrate self-control will improve     Medication Management: Evaluate patient's response, side effects, and tolerance of medication regimen.  Therapeutic Interventions: 1 to 1 sessions, Unit Group sessions and Medication administration.  Evaluation of Outcomes: Not Progressing   RN Treatment Plan for Primary Diagnosis: Severe recurrent major depressive disorder with psychotic features (HCC) Long Term Goal(s): Knowledge of disease and therapeutic regimen to maintain health will improve  Short Term Goals: Ability to remain  free from injury will improve, Ability to verbalize frustration and anger appropriately will improve, Ability to demonstrate self-control, Ability to participate in decision making will improve, Ability to verbalize feelings will improve, Ability to disclose and discuss suicidal ideas, Ability to identify and develop effective coping behaviors will improve, and Compliance with prescribed medications will improve  Medication Management: RN will administer medications as ordered by provider, will assess and evaluate patient's response and provide education to patient for prescribed medication. RN will report any adverse and/or side effects to prescribing provider.  Therapeutic Interventions: 1 on 1 counseling sessions, Psychoeducation, Medication administration, Evaluate responses to treatment, Monitor vital signs and CBGs as ordered, Perform/monitor CIWA, COWS, AIMS and Fall Risk screenings as ordered, Perform wound care treatments as ordered.  Evaluation of Outcomes: Not Progressing   LCSW Treatment Plan for Primary Diagnosis: Severe recurrent major depressive disorder with psychotic features (HCC) Long Term Goal(s): Safe transition to appropriate next level of care at  discharge, Engage patient in therapeutic group addressing interpersonal concerns.  Short Term Goals: Engage patient in aftercare planning with referrals and resources, Increase social support, Increase ability to appropriately verbalize feelings, Increase emotional regulation, Facilitate acceptance of mental health diagnosis and concerns, Facilitate patient progression through stages of change regarding substance use diagnoses and concerns, Identify triggers associated with mental health/substance abuse issues, and Increase skills for wellness and recovery  Therapeutic Interventions: Assess for all discharge needs, 1 to 1 time with Social worker, Explore available resources and support systems, Assess for adequacy in community support  network, Educate family and significant other(s) on suicide prevention, Complete Psychosocial Assessment, Interpersonal group therapy.  Evaluation of Outcomes: Not Progressing   Progress in Treatment: Attending groups: attended some groups Participating in groups: Yes. Taking medication as prescribed: Yes. Toleration medication: Yes. Family/Significant other contact made: No, will contact:  Frank Stark (mom) [phone] Patient understands diagnosis: Yes. Discussing patient identified problems/goals with staff: Yes. Medical problems stabilized or resolved: Yes. Denies suicidal/homicidal ideation: Yes. Issues/concerns per patient self-inventory: No.  New problem(s) identified:  No  New Short Term/Long Term Goal(s):    medication stabilization, elimination of SI thoughts, development of comprehensive mental wellness plan.    Patient Goals:  I want to get the right medications.   Discharge Plan or Barriers:  Patient recently admitted. CSW will continue to follow and assess for appropriate referrals and possible discharge planning.    Reason for Continuation of Hospitalization: Depression Hallucinations Medication stabilization Suicidal ideation  Estimated Length of Stay:  5 - 7 days  Last 3 [place] Suicide Severity Risk Score: Flowsheet Row Ad[facility] (Current) [facility] from 10[facility]15/2023 in [facility] at [facility] ED from 04/22/2023 in [facility] Department at [facility]  C-SSRS RISK CATEGORY High Risk High Risk No Risk    Last [facility] 2/9 Scores:    02/11/2014   10:22 AM 07/21/2013    4:12 PM 05/19/2013    3:37 PM  Depression screen PHQ 2/9  Decreased Interest 1 0 0  Down, Depressed, Hopeless 1 1 1   PHQ - 2 Score 2 1 1   Altered sleeping 1    Tired, decreased energy 1    Change in appetite 2    Feeling bad or failure about yourself  1    Trouble concentrating 0     Moving slowly or fidgety/restless 0    Suicidal thoughts 0     PHQ-9 Score 7       Data saved with a previous flowsheet row definition    Scribe for Treatment Team: Kavina Cantave O Airis Barbee, LCSWA 10/18/2023 1:03 PM

## 2023-10-18 NOTE — Progress Notes (Signed)
 Pt did not attend goals group.

## 2023-10-18 NOTE — Progress Notes (Addendum)

## 2023-10-18 NOTE — Group Note (Signed)
 Date:  10/18/2023 Time:  10:15 PM  Group Topic/Focus:  Wrap-Up Group:   The focus of this group is to help patients review their daily goal of treatment and discuss progress on daily workbooks.    Participation Level:  Active  Participation Quality:  Appropriate  Affect:  Appropriate  Cognitive:  Appropriate  Insight: Appropriate  Engagement in Group:  Engaged  Modes of Intervention:  Discussion  Additional Comments:  Patient had a good day he rated it a 10 out of 10 day   Bari Moats 10/18/2023, 10:15 PM

## 2023-10-18 NOTE — Progress Notes (Signed)
(  Sleep Hours) - 7.75 (Any PRNs that were needed, meds refused, or side effects to meds)-  None (Any disturbances and when (visitation, over night)- None (Concerns raised by the patient)- None (SI/HI/AVH)- Passive SI, contracts for safety. Endorsing auditory hallucinations.

## 2023-10-19 LAB — LIPID PANEL
Cholesterol: 203 mg/dL — ABNORMAL HIGH (ref 0–200)
HDL: 64 mg/dL (ref >40)
LDL Cholesterol: 108 mg/dL — ABNORMAL HIGH (ref 0–99)
Total CHOL/HDL Ratio: 3.2 ratio
Triglycerides: 154 mg/dL — ABNORMAL HIGH (ref <150)
VLDL: 31 mg/dL (ref 0–40)

## 2023-10-19 LAB — TSH: TSH: 1.34 u[IU]/mL (ref 0.350–4.500)

## 2023-10-19 LAB — VITAMIN B12: Vitamin B-12: 568 pg/mL (ref 180–914)

## 2023-10-19 LAB — FOLATE: Folate: 6.4 ng/mL (ref >5.9)

## 2023-10-19 MED ORDER — QUETIAPINE FUMARATE 200 MG PO TABS
200.0000 mg | ORAL_TABLET | Freq: Every morning | ORAL | Status: DC
  Administered 2023-10-19 – 2023-10-20 (×2): 200 mg via ORAL
  Filled 2023-10-19: qty 2
  Filled 2023-10-19: qty 1

## 2023-10-19 MED ORDER — LIDOCAINE 5 % EX PTCH
1.0000 | MEDICATED_PATCH | CUTANEOUS | Status: DC
  Administered 2023-10-19 – 2023-10-23 (×5): 1 via TRANSDERMAL
  Filled 2023-10-19 (×5): qty 1

## 2023-10-19 MED ORDER — TRAZODONE HCL 100 MG PO TABS
200.0000 mg | ORAL_TABLET | Freq: Every day | ORAL | Status: DC
  Administered 2023-10-19 – 2023-10-22 (×4): 200 mg via ORAL
  Filled 2023-10-19 (×4): qty 2

## 2023-10-19 MED ORDER — DULOXETINE HCL 30 MG PO CPEP
30.0000 mg | ORAL_CAPSULE | Freq: Every day | ORAL | Status: DC
  Administered 2023-10-20: 30 mg via ORAL
  Filled 2023-10-19: qty 1

## 2023-10-19 NOTE — Progress Notes (Signed)
 [facility] MD Progress Note  10/19/2023 12:37 PM Frank Stark  MRN:  [id number] Principal Problem: Severe recurrent major depressive disorder with psychotic features [facility]) Diagnosis: Principal Problem:   Severe recurrent major depressive disorder with psychotic features [facility]) Active Problems:   Hallucinations   ID & Admission Information: Frank Stark is an 43 y.o. male who  has a past medical history of Anxiety, Depression, and HIV (human immunodeficiency virus infection) (HCC).  He presented on 10/16/2023 11:41 AM for Severe recurrent major depressive disorder with psychotic features (HCC).  He presented with complaints of depression and auditory hallucinations.  Subjective:   Case was discussed in the multidisciplinary team. MAR was reviewed and patient was compliant with medications.   Patient continues to report insomnia.  He continues to report auditory hallucinations.  He describes the voices as multiple voices in the back of his head.  He recognizes the people who are speaking, but has difficulty discerning what they are saying.  He finds the hallucinations distressing.  He does not feel like the Seroquel  is doing anything to decrease them at this point.  He also states that he does not find the Seroquel  sedating.  We discussed switching to twice a day dosing on the Seroquel  as he states is not sedating.  Will also plan on increasing trazodone  to help with insomnia.   Past Psychiatric and Medical Medical History:  Past Medical History:  Diagnosis Date   Anxiety    Depression    HIV (human immunodeficiency virus infection) (HCC)     History reviewed. No pertinent surgical history.  Family History(Medical and Psychiatric):  Family History  Problem Relation Age of Onset   Hypertension Mother    Hypertension Father    Asthma Sister    Diabetes Maternal Grandmother    Dementia Maternal Grandmother    Glaucoma Maternal Grandfather        Social History:  Social History    Substance and Sexual Activity  Alcohol Use Not Currently   Comment: rarely     Social History   Substance and Sexual Activity  Drug Use Not Currently   Comment: occasional    Social History   Socioeconomic History   Marital status: Single    Spouse name: Not on file   Number of children: Not on file   Years of education: Not on file   Highest education level: Not on file  Occupational History   Not on file  Tobacco Use   Smoking status: Every Day    Current packs/day: 0.00    Types: Cigarettes   Smokeless tobacco: Never  Vaping Use   Vaping status: Every Day  Substance and Sexual Activity   Alcohol use: Not Currently    Comment: rarely   Drug use: Not Currently    Comment: occasional   Sexual activity: Yes    Partners: Male    Comment: pt given condoms  Other Topics Concern   Not on file  Social History Narrative   Not on file   Social Drivers of Health   Financial Resource Strain: Not on file  Food Insecurity: No Food Insecurity (10/16/2023)   Hunger Vital Sign    Worried About Running Out of Food in the Last Year: Never true    Ran Out of Food in the Last Year: Never true  Transportation Needs: No Transportation Needs (10/16/2023)   PRAPARE - Administrator, Civil Service (Medical): No    Lack of Transportation (Non-Medical): No  Physical Activity: Not on file  Stress: Not on file  Social Connections: Unknown (06/15/2021)   Received [facility]   Social Network    Social Network: Not on file        Current Medications: Current Facility-Administered Medications  Medication Dose Route Frequency Provider Last Rate Last Admin   acetaminophen  (TYLENOL ) tablet 650 mg  650 mg Oral Q6H PRN Weber, Kyra A, NP   650 mg at 10/19/23 0813   albuterol  (VENTOLIN  HFA) 108 (90 Base) MCG/ACT inhaler 1-2 puff  1-2 puff Inhalation Q6H PRN Bobbitt, Shalon E, NP   2 puff at 10/19/23 0813   alum & mag hydroxide-simeth (MAALOX/MYLANTA) 200-200-20 MG/5ML  suspension 30 mL  30 mL Oral Q4H PRN Weber, Kyra A, NP       benzocaine  (ORAJEL) 10 % mucosal gel   Mouth/Throat BID PRN Bobbitt, Shalon E, NP   Given at 10/18/23 2237   bictegravir-emtricitabine -tenofovir  AF (BIKTARVY ) 50-200-25 MG per tablet 1 tablet  1 tablet Oral Daily Weber, Kyra A, NP   1 tablet at 10/19/23 0814   haloperidol  (HALDOL ) tablet 5 mg  5 mg Oral TID PRN Weber, Kyra A, NP   5 mg at 10/16/23 1533   And   diphenhydrAMINE  (BENADRYL ) capsule 50 mg  50 mg Oral TID PRN Weber, Kyra A, NP   50 mg at 10/16/23 1533   haloperidol  lactate (HALDOL ) injection 5 mg  5 mg Intramuscular TID PRN Weber, Kyra A, NP       And   diphenhydrAMINE  (BENADRYL ) injection 50 mg  50 mg Intramuscular TID PRN Weber, Kyra A, NP       And   LORazepam  (ATIVAN ) injection 2 mg  2 mg Intramuscular TID PRN Weber, Kyra A, NP       haloperidol  lactate (HALDOL ) injection 10 mg  10 mg Intramuscular TID PRN Weber, Kyra A, NP       And   diphenhydrAMINE  (BENADRYL ) injection 50 mg  50 mg Intramuscular TID PRN Weber, Kyra A, NP       And   LORazepam  (ATIVAN ) injection 2 mg  2 mg Intramuscular TID PRN Weber, Kyra A, NP       [START ON 10/20/2023] DULoxetine  (CYMBALTA ) DR capsule 30 mg  30 mg Oral Daily Kennyth Starleen RAMAN, MD       gabapentin  (NEURONTIN ) capsule 300 mg  300 mg Oral Daily Weber, Kyra A, NP   300 mg at 10/19/23 9185   magnesium  hydroxide (MILK OF MAGNESIA) suspension 30 mL  30 mL Oral Daily PRN Weber, Kyra A, NP       naproxen  (NAPROSYN ) tablet 250 mg  250 mg Oral TID WC Weber, Kyra A, NP   250 mg at 10/19/23 1123   nicotine  (NICODERM CQ  - dosed in mg/24 hours) patch 14 mg  14 mg Transdermal Daily Livio Ledwith S, MD   14 mg at 10/19/23 0815   nicotine  polacrilex (NICORETTE ) gum 2 mg  2 mg Oral PRN Woodfin Kiss S, MD   2 mg at 10/19/23 1123   QUEtiapine  (SEROQUEL ) tablet 200 mg  200 mg Oral q AM Harold Mattes S, MD       QUEtiapine  (SEROQUEL ) tablet 400 mg  400 mg Oral QHS Collene Gouge I, NP   400 mg at 10/18/23  2124   traZODone  (DESYREL ) tablet 200 mg  200 mg Oral QHS Kennyth Starleen RAMAN, MD        Lab Results:  No results found for this  or any previous visit (from the past 48 hours).  Blood Alcohol level:  Lab Results  Component Value Date   [facility] <15 10/15/2023    Metabolic Disorder Labs: No results found for: HGBA1C, MPG No results found for: PROLACTIN Lab Results  Component Value Date   CHOL 121 02/26/2012   TRIG 72 02/26/2012   HDL 36 (L) 02/26/2012   CHOLHDL 3.4 02/26/2012   VLDL 14 02/26/2012   LDLCALC 71 02/26/2012    Physical Findings: AIMS:  , ,  ,  ,    CIWA:    COWS:     Psychiatric Specialty Exam:  Presentation  General Appearance: Appropriate for Environment; Casual  Eye Contact: Good  Speech: Clear and Coherent; Normal Rate  Speech Volume: Normal  Handedness: Right   Mood and Affect  Mood: Euthymic  Affect: Congruent   Thought Process  Thought Processes: Coherent; Linear  Descriptions of Associations: Intact  Orientation: Full (Time, Place and Person)  Thought Content: Logical  History of Schizophrenia/Schizoaffective disorder: No  Duration of Psychotic Symptoms: NA Hallucinations: Hallucinations: -- (Faint auditory hallucinations.) Description of Auditory Hallucinations: I'm unable to make out what the voices are saying.  Ideas of Reference: None  Suicidal Thoughts: Suicidal Thoughts: No  Homicidal Thoughts: Homicidal Thoughts: No   Sensorium  Memory: Immediate Good; Recent Good; Remote Good  Judgment: Good  Insight: Fair   Art therapist  Concentration: Good  Attention Span: Good  Recall: Good  Fund of Knowledge: Good  Language: Good   Psychomotor Activity  Psychomotor Activity: Psychomotor Activity: Normal   Assets  Assets: Communication Skills; Desire for Improvement; Housing; Social Support   Sleep  Sleep: Sleep: Good Number of Hours of Sleep: 8.5   Musculoskeletal: Strength & Muscle Tone:  within normal limits Gait & Station: normal Patient leans: N/A   Physical Exam: General: Sitting comfortably. NAD. HEENT: Normocephalic, atraumatic, MMM, EMOI Lungs: no increased work of breathing noted Heart: no cyanosis Abdomen: Non distended Musculoskeletal: FROM. No obvious deformities Skin: Warm, dry, intact. No rashes noted Neuro: No obvious focal deficits.  Gait and station are normal  Review of Systems  Constitutional: Negative.   HENT: Negative.    Eyes: Negative.   Respiratory: Negative.    Cardiovascular: Negative.   Gastrointestinal: Negative.   Genitourinary: Negative.   Skin: Negative.   Neurological: Negative.   Psychiatric/Behavioral:  Positive for auditory hallucinations, depressed mood.     Blood pressure 110/72, pulse 83, temperature 98 F (36.7 C), temperature source Oral, resp. rate 16, height 5' 11 (1.803 m), weight 75.8 kg, SpO2 98%. Body mass index is 23.29 kg/m.  ASSESSMENT: Frank Stark is an 43 y.o. male who  has a past medical history of Anxiety, Depression, and HIV (human immunodeficiency virus infection) (HCC).  He presented on 10/16/2023 11:41 AM for Severe recurrent major depressive disorder with psychotic features (HCC).    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   Papanicolaou smear of anus with atypical squamous cells of undetermined significance (ASC-US ) 02/27/2012     Scheduled Medications:  bictegravir-emtricitabine -tenofovir  AF  1 tablet Oral Daily   [START ON 10/20/2023] DULoxetine   30 mg Oral Daily   gabapentin   300 mg Oral Daily   naproxen   250 mg Oral TID WC   nicotine   14 mg Transdermal Daily   QUEtiapine   200 mg Oral q AM   QUEtiapine   400 mg Oral QHS   traZODone   200 mg Oral QHS    As Needed Medications: acetaminophen , albuterol , alum & mag hydroxide-simeth, benzocaine , haloperidol  **AND** diphenhydrAMINE , haloperidol  lactate **AND** diphenhydrAMINE  **AND** LORazepam , haloperidol  lactate **AND** diphenhydrAMINE  **AND** LORazepam , magnesium  hydroxide, nicotine  polacrilex    3. Medical Issues Being Addressed:   -- No significant medical problems  Labs reviewed, unremarkable   Tobacco Use Disorder  -- Nicotine  replacement as above  -- Smoking cessation encouraged  4. Discharge Planning:   -- Social work and case management to assist with discharge planning and identification of hospital follow-up needs prior to discharge  -- Estimated LOS:  Likely discharge early next week  -- Discharge Concerns: Need to establish a safety plan; Medication compliance and effectiveness  -- Discharge Goals: Return home with outpatient referrals for mental health follow-up including medication management/psychotherapy  5. Short Term Goals:  Improve ability to identify changes in lifestyle to reduce recurrence of condition, verbalize feelings, disclose and discuss suicidal ideas, demonstrate self-control, identify and develop effective coping behaviors, compliance with prescribed medications, identify triggers associated with substance abuse/mental health issues, participate in unit milieu and in scheduled group therapies   6. Long Term Goals: Improvement in symptoms so the patient is ready for discharge   --The risks/benefits/side-effects/alternatives to the medications above were discussed in detail with the patient and time was given for questions. The patient provided informed consent.   -- Metabolic profile and EKG monitoring obtained while on an atypical antipsychotic and listed in the EHR    Total Time Spent in Direct Patient Care:  I personally spent 35 minutes on the unit in direct patient care. The direct patient care time included face-to-face time with the patient, reviewing the patient's chart, communicating with other professionals, and coordinating care. Greater than 50% of this time was spent in counseling or coordinating care with the patient regarding goals of hospitalization, psycho-education, and discharge planning needs.      Glendia Kitty, MD Psychiatrist  10/19/2023, 12:37 PM   I certify that inpatient services furnished can reasonably be expected to improve the patient's condition.    Portions of this note were created using voice recognition software. Minor syntax errors, grammatical content, spelling, or punctuation errors  may have occurred unintentionally. Please notify the dino if the meaning of any statement is  unclear.

## 2023-10-19 NOTE — Progress Notes (Signed)
   10/19/23 0900  Psych Admission Type (Psych Patients Only)  Admission Status Voluntary  Psychosocial Assessment  Patient Complaints Anxiety (5/10)  Eye Contact Fair  Facial Expression Anxious  Affect Anxious  Speech Logical/coherent  Interaction Cautious  Motor Activity Slow  Appearance/Hygiene Unremarkable  Behavior Characteristics Cooperative  Mood Anxious  Thought Process  Coherency WDL  Content WDL  Delusions None reported or observed  Perception Hallucinations  Hallucination Auditory (states hears converstaions and do not know here they are coming from)  Judgment Impaired  Confusion None  Danger to Self  Current suicidal ideation? Passive  Description of Suicide Plan no plan  Agreement Not to Harm Self Yes  Description of Agreement verbal  Danger to Others  Danger to Others None reported or observed

## 2023-10-19 NOTE — Plan of Care (Signed)
  Problem: Activity: Goal: Will verbalize the importance of balancing activity with adequate rest periods Outcome: Progressing   

## 2023-10-19 NOTE — Group Note (Signed)
 Date:  10/19/2023 Time:  10:53 AM  Group Topic/Focus:  Safety Plan This group focused on helping patients identify personal safety goals and strategies to support emotional and physical well-being. Patients discussed the meaning of safety, explored warning signs, coping skills, and sources of support, and were encouraged to share individualized safety goals. The group emphasized self-awareness, personal strengths, and the importance of proactive planning in maintaining safety and supporting recovery.    Participation Level:  Active  Participation Quality:  Appropriate, Attentive, and Sharing  Affect:  Appropriate  Cognitive:  Alert and Appropriate  Insight: Appropriate and Improving  Engagement in Group:  Engaged and Improving  Modes of Intervention:  Discussion, Education, Exploration, Socialization, and Support  Additional Comments:  Pt attended and participated in this safety plan group.  [name] Ozan Maclay 10/19/2023, 10:53 AM

## 2023-10-19 NOTE — Progress Notes (Addendum)
 Nursing Progress Note:   (Sleep Hours) - 7.25 hours  (Any PRNs that were needed, meds refused, or side effects to meds)- Albuterol  PRN, Benzocaine  PRN,   (Any disturbances and when (visitation, over night)- None  (Concerns raised by the patient)- Pt complained of wisdom teeth pain; Pt complained of symptoms r/t Asthma  (SI/HI/AVH)- Passive SI & AH; Denies VH

## 2023-10-19 NOTE — Plan of Care (Signed)
  Problem: Education: Goal: Knowledge of the prescribed therapeutic regimen will improve Outcome: Progressing   Problem: Coping: Goal: Coping ability will improve Outcome: Progressing Goal: Will verbalize feelings Outcome: Progressing   Problem: Health Behavior/Discharge Planning: Goal: Compliance with prescribed medication regimen will improve Outcome: Progressing   Problem: Nutritional: Goal: Ability to achieve adequate nutritional intake will improve Outcome: Progressing   Problem: Role Relationship: Goal: Ability to communicate needs accurately will improve Outcome: Progressing Goal: Ability to interact with others will improve Outcome: Progressing   Problem: Self-Concept: Goal: Will verbalize positive feelings about self Outcome: Progressing

## 2023-10-19 NOTE — Group Note (Signed)
 Date:  10/19/2023 Time:  8:48 PM  Group Topic/Focus:  Wrap-Up Group:   The focus of this group is to help patients review their daily goal of treatment and discuss progress on daily workbooks.    Participation Level:  Active  Participation Quality:  Attentive, Sharing, and Supportive  Affect:  Appropriate  Cognitive:  Appropriate  Insight: Good  Engagement in Group:  Engaged  Modes of Intervention:  Discussion and Support  Additional Comments:  Frank Stark appeared engaged in wrap up group. He described himself as creative and talented. He described feeling proud of having a supportive family. His goal for tomorrow is to find a way to reduce pain on his side. He believes that the [facility] Staff can supportive him by listening actively and being non judgemental. Pt will continue to work on treatment goals.   [name] 10/19/2023, 8:48 PM

## 2023-10-19 NOTE — Group Note (Signed)
 Date:  10/19/2023 Time:  10:29 AM  Group Topic/Focus:  Goals Group:   The focus of this group is to help patients establish daily goals to achieve during treatment and discuss how the patient can incorporate goal setting into their daily lives to aide in recovery.    Participation Level:  Active  Participation Quality:  Appropriate, Attentive, and Sharing  Affect:  Appropriate  Cognitive:  Alert and Appropriate  Insight: Appropriate and Improving  Engagement in Group:  Engaged and Improving  Modes of Intervention:  Discussion, Exploration, and Socialization  Additional Comments:  Pt attended and participated in goals group. He was respectful toward his peers. He shares his goals are: correct meds that help me with the symptoms I have, stop smoking cigarettes and to stop vaping. Frank Stark shares he can reach these goals by trial and error, nicotine  patches and gum.  [name] Frank Stark 10/19/2023, 10:29 AM

## 2023-10-19 NOTE — [facility] Group Notes (Addendum)
 LCSW Wellness Group Note   10/19/2023 11:00am  Type of Group and Topic: Psychoeducational Group:  Wellness  Participation Level:  none  Description of Group  Wellness group introduces the topic and its focus on developing healthy habits across the spectrum and its relationship to a decrease in hospital admissions.  Six areas of wellness are discussed: physical, social spiritual, intellectual, occupational, and emotional.  Patients are asked to consider their current wellness habits and to identify areas of wellness where they are interested and able to focus on improvements.    Therapeutic Goals Patients will understand components of wellness and how they can positively impact overall health.  Patients will identify areas of wellness where they have developed good habits. Patients will identify areas of wellness where they would like to make improvements.    Summary of Patient Progress: pt entered group near the end but did not participate.      Therapeutic Modalities: Cognitive Behavioral Therapy Psychoeducation    Bridget Cordella Simmonds, LCSW

## 2023-10-19 NOTE — Progress Notes (Signed)
   10/18/23 2237  Psych Admission Type (Psych Patients Only)  Admission Status Voluntary  Psychosocial Assessment  Patient Complaints Anxiety  Eye Contact Fair  Facial Expression Anxious  Affect Anxious  Speech Logical/coherent  Interaction Cautious  Motor Activity Slow  Appearance/Hygiene Unremarkable  Behavior Characteristics Cooperative  Mood Anxious  Thought Process  Coherency Disorganized  Content Delusions  Delusions Paranoid  Perception Hallucinations  Hallucination Auditory  Judgment Impaired  Confusion None  Danger to Self  Current suicidal ideation? Passive  Agreement Not to Harm Self Yes  Description of Agreement verbal  Danger to Others  Danger to Others None reported or observed

## 2023-10-19 NOTE — Group Note (Signed)
 Date:  10/19/2023 Time:  4:38 PM  Group Topic/Focus:  Self Care:   The focus of this group is to help patients understand the importance of self-care and sleep hygiene in order to improve or restore emotional, physical, spiritual, interpersonal, and financial health.    Participation Level:  Active  Participation Quality:  Appropriate, Attentive, and Sharing  Affect:  Appropriate  Cognitive:  Alert and Appropriate  Insight: Appropriate  Engagement in Group:  Developing/Improving  Modes of Intervention:  Discussion   Powell JAYSON Sharps 10/19/2023, 4:38 PM

## 2023-10-19 NOTE — Plan of Care (Signed)
  Problem: Self-Concept: Goal: Will verbalize positive feelings about self Outcome: Progressing   

## 2023-10-20 LAB — HEMOGLOBIN A1C
Hgb A1c MFr Bld: 5.8 % — ABNORMAL HIGH (ref 4.8–5.6)
Mean Plasma Glucose: 119.76 mg/dL

## 2023-10-20 LAB — VITAMIN D 25 HYDROXY (VIT D DEFICIENCY, FRACTURES): Vit D, 25-Hydroxy: 24.53 ng/mL — ABNORMAL LOW (ref 30–100)

## 2023-10-20 LAB — RPR
RPR Ser Ql: REACTIVE — AB
RPR Titer: 1:1 {titer}

## 2023-10-20 MED ORDER — VITAMIN D (ERGOCALCIFEROL) 1.25 MG (50000 UNIT) PO CAPS
50000.0000 [IU] | ORAL_CAPSULE | Freq: Two times a day (BID) | ORAL | Status: DC
  Administered 2023-10-20 – 2023-10-23 (×6): 50000 [IU] via ORAL
  Filled 2023-10-20 (×7): qty 1

## 2023-10-20 MED ORDER — ZOLPIDEM TARTRATE 5 MG PO TABS
10.0000 mg | ORAL_TABLET | Freq: Every evening | ORAL | Status: DC | PRN
  Administered 2023-10-21: 10 mg via ORAL
  Filled 2023-10-20: qty 2

## 2023-10-20 MED ORDER — ARIPIPRAZOLE 15 MG PO TABS
15.0000 mg | ORAL_TABLET | Freq: Every day | ORAL | Status: DC
  Administered 2023-10-20 – 2023-10-21 (×2): 15 mg via ORAL
  Filled 2023-10-20 (×2): qty 1

## 2023-10-20 MED ORDER — DULOXETINE HCL 60 MG PO CPEP
60.0000 mg | ORAL_CAPSULE | Freq: Every day | ORAL | Status: DC
  Administered 2023-10-21 – 2023-10-23 (×3): 60 mg via ORAL
  Filled 2023-10-20 (×3): qty 1

## 2023-10-20 NOTE — Progress Notes (Signed)
 [facility] MD Progress Note  10/20/2023 2:55 PM Frank Stark  MRN:  [id number] Principal Problem: Severe recurrent major depressive disorder with psychotic features [facility]) Diagnosis: Principal Problem:   Severe recurrent major depressive disorder with psychotic features [facility]) Active Problems:   Hallucinations   ID & Admission Information: Frank Stark is an 43 y.o. male who  has a past medical history of Anxiety, Depression, and HIV (human immunodeficiency virus infection) (HCC).  He presented on 10/16/2023 11:41 AM for Severe recurrent major depressive disorder with psychotic features (HCC).  He presented with complaints of depression and auditory hallucinations.  Subjective:   Case was discussed in the multidisciplinary team. MAR was reviewed and patient was compliant with medications.   Patient was seen in the interview room during rounds.  At his request, I added 200 mg of Seroquel  in the morning to his 400 mg in the evening.  He was visibly sedated and in bed around lunchtime when I saw him.  He also feels like he is had no improvement in hallucinations since starting on Seroquel .  That combined with the fact that he has had weight gain, is now prediabetic, and has elevated cholesterol seem like a good reason to switch his medication.  Will plan on starting Abilify  today.  He is tolerating the Cymbalta  without issue.  We discussed increasing that to 60 mg which is approximately equivalent to the 40 mg of Prozac  he was taking when he came in.  I also added Ambien  PRN tonight to ensure that he sleeps after abruptly discontinuing Seroquel .   Past Psychiatric and Medical Medical History:  Past Medical History:  Diagnosis Date   Anxiety    Depression    HIV (human immunodeficiency virus infection) (HCC)     History reviewed. No pertinent surgical history.  Family History(Medical and Psychiatric):  Family History  Problem Relation Age of Onset   Hypertension Mother    Hypertension Father     Asthma Sister    Diabetes Maternal Grandmother    Dementia Maternal Grandmother    Glaucoma Maternal Grandfather        Social History:  Social History   Substance and Sexual Activity  Alcohol Use Not Currently   Comment: rarely     Social History   Substance and Sexual Activity  Drug Use Not Currently   Comment: occasional    Social History   Socioeconomic History   Marital status: Single    Spouse name: Not on file   Number of children: Not on file   Years of education: Not on file   Highest education level: Not on file  Occupational History   Not on file  Tobacco Use   Smoking status: Every Day    Current packs/day: 0.00    Types: Cigarettes   Smokeless tobacco: Never  Vaping Use   Vaping status: Every Day  Substance and Sexual Activity   Alcohol use: Not Currently    Comment: rarely   Drug use: Not Currently    Comment: occasional   Sexual activity: Yes    Partners: Male    Comment: pt given condoms  Other Topics Concern   Not on file  Social History Narrative   Not on file   Social Drivers of Health   Financial Resource Strain: Not on file  Food Insecurity: No Food Insecurity (10/16/2023)   Hunger Vital Sign    Worried About Running Out of Food in the Last Year: Never true    Ran Out  of Food in the Last Year: Never true  Transportation Needs: No Transportation Needs (10/16/2023)   PRAPARE - Administrator, Civil Service (Medical): No    Lack of Transportation (Non-Medical): No  Physical Activity: Not on file  Stress: Not on file  Social Connections: Unknown (06/15/2021)   [facility]   Social Network    Social Network: Not on file        Current Medications: Current Facility-Administered Medications  Medication Dose Route Frequency Provider Last Rate Last Admin   acetaminophen  (TYLENOL ) tablet 650 mg  650 mg Oral Q6H PRN Weber, Kyra A, NP   650 mg at 10/19/23 1937   albuterol  (VENTOLIN  HFA) 108 (90 Base)  MCG/ACT inhaler 1-2 puff  1-2 puff Inhalation Q6H PRN Bobbitt, Shalon E, NP   2 puff at 10/19/23 0813   alum & mag hydroxide-simeth (MAALOX/MYLANTA) 200-200-20 MG/5ML suspension 30 mL  30 mL Oral Q4H PRN Weber, Kyra A, NP       ARIPiprazole  (ABILIFY ) tablet 15 mg  15 mg Oral QHS Raed Schalk S, MD       benzocaine  (ORAJEL) 10 % mucosal gel   Mouth/Throat BID PRN Bobbitt, Shalon E, NP   Given at 10/20/23 1206   bictegravir-emtricitabine -tenofovir  AF (BIKTARVY ) 50-200-25 MG per tablet 1 tablet  1 tablet Oral Daily Weber, Kyra A, NP   1 tablet at 10/20/23 0832   haloperidol  (HALDOL ) tablet 5 mg  5 mg Oral TID PRN Weber, Kyra A, NP   5 mg at 10/16/23 1533   And   diphenhydrAMINE  (BENADRYL ) capsule 50 mg  50 mg Oral TID PRN Weber, Kyra A, NP   50 mg at 10/16/23 1533   haloperidol  lactate (HALDOL ) injection 5 mg  5 mg Intramuscular TID PRN Weber, Kyra A, NP       And   diphenhydrAMINE  (BENADRYL ) injection 50 mg  50 mg Intramuscular TID PRN Weber, Kyra A, NP       And   LORazepam  (ATIVAN ) injection 2 mg  2 mg Intramuscular TID PRN Weber, Kyra A, NP       haloperidol  lactate (HALDOL ) injection 10 mg  10 mg Intramuscular TID PRN Weber, Kyra A, NP       And   diphenhydrAMINE  (BENADRYL ) injection 50 mg  50 mg Intramuscular TID PRN Weber, Kyra A, NP       And   LORazepam  (ATIVAN ) injection 2 mg  2 mg Intramuscular TID PRN Weber, Kyra A, NP       [START ON 10/21/2023] DULoxetine  (CYMBALTA ) DR capsule 60 mg  60 mg Oral Daily Kennyth Starleen RAMAN, MD       gabapentin  (NEURONTIN ) capsule 300 mg  300 mg Oral Daily Weber, Kyra A, NP   300 mg at 10/20/23 0832   lidocaine  (LIDODERM ) 5 % 1 patch  1 patch Transdermal Q24H Kennyth Starleen RAMAN, MD   1 patch at 10/20/23 1320   magnesium  hydroxide (MILK OF MAGNESIA) suspension 30 mL  30 mL Oral Daily PRN Weber, Kyra A, NP       naproxen  (NAPROSYN ) tablet 250 mg  250 mg Oral TID WC Weber, Kyra A, NP   250 mg at 10/20/23 1141   nicotine  (NICODERM CQ  - dosed in mg/24 hours) patch  14 mg  14 mg Transdermal Daily Ammy Lienhard S, MD   14 mg at 10/20/23 9167   nicotine  polacrilex (NICORETTE ) gum 2 mg  2 mg Oral PRN Kennyth Starleen RAMAN, MD  2 mg at 10/20/23 1323   traZODone  (DESYREL ) tablet 200 mg  200 mg Oral QHS Rashidah Belleville S, MD   200 mg at 10/19/23 2110   Vitamin D  (Ergocalciferol ) (DRISDOL ) 1.25 MG (50000 UNIT) capsule 50,000 Units  50,000 Units Oral BID Roshini Fulwider S, MD   50,000 Units at 10/20/23 1141   zolpidem  (AMBIEN ) tablet 10 mg  10 mg Oral QHS PRN Kennyth Starleen RAMAN, MD        Lab Results:  Results for orders placed or performed during the hospital encounter of 10/16/23 (from the past 48 hours)  Folate     Status: None   Collection Time: 10/19/23  6:44 PM  Result Value Ref Range   Folate 6.4 >5.9 ng/mL    Comment: Performed at [facility], [address] [place], [place] [address]  Hemoglobin A1c     Status: Abnormal   Collection Time: 10/19/23  6:44 PM  Result Value Ref Range   Hgb A1c MFr Bld 5.8 (H) 4.8 - 5.6 %    Comment: (NOTE) Diagnosis of Diabetes The following HbA1c ranges recommended by the American Diabetes Association (ADA) may be used as an aid in the diagnosis of diabetes mellitus.  Hemoglobin             Suggested A1C NGSP%              Diagnosis  <5.7                   Non Diabetic  5.7-6.4                Pre-Diabetic  >6.4                   Diabetic  <7.0                   Glycemic control for                       adults with diabetes.     Mean Plasma Glucose 119.76 mg/dL    Comment: Performed at [facility], [address], [place], [place] [address]  Lipid panel     Status: Abnormal   Collection Time: 10/19/23  6:44 PM  Result Value Ref Range   Cholesterol 203 (H) 0 - 200 mg/dL    Comment:        ATP III CLASSIFICATION:  <200     mg/dL   Desirable  799-760  mg/dL   Borderline High  >=759    mg/dL   High           Triglycerides 154 (H) <150 mg/dL   HDL 64 >59 mg/dL   Total CHOL/HDL Ratio  3.2 RATIO   VLDL 31 0 - 40 mg/dL   LDL Cholesterol 891 (H) 0 - 99 mg/dL    Comment:        Total Cholesterol/HDL:CHD Risk Coronary Heart Disease Risk Table                     Men   Women  1/2 Average Risk   3.4   3.3  Average Risk       5.0   4.4  2 X Average Risk   9.6   7.1  3 X Average Risk  23.4   11.0        Use the calculated Patient Ratio above and the CHD Risk Table  to determine the patient's CHD Risk.        ATP III CLASSIFICATION (LDL):  <100     mg/dL   Optimal  899-870  mg/dL   Near or Above                    Optimal  130-159  mg/dL   Borderline  839-810  mg/dL   High  >809     mg/dL   Very High Performed at [facility], [address], [place], [place] [address]   RPR     Status: Abnormal   Collection Time: 10/19/23  6:44 PM  Result Value Ref Range   RPR Ser Ql Reactive (A) NON REACTIVE    Comment: SENT FOR CONFIRMATION   RPR Titer 1:1     Comment: Performed at [facility], [address] [place], [place] [address]  TSH     Status: None   Collection Time: 10/19/23  6:44 PM  Result Value Ref Range   TSH 1.340 0.350 - 4.500 uIU/mL    Comment: Performed at [facility], [address] [place], [place] [address]  Vitamin B12     Status: None   Collection Time: 10/19/23  6:44 PM  Result Value Ref Range   Vitamin B-12 568 180 - 914 pg/mL    Comment: Performed at [facility], [address] [place], [place] [address]  VITAMIN D  25 Hydroxy (Vit-D Deficiency, Fractures)     Status: Abnormal   Collection Time: 10/19/23  6:44 PM  Result Value Ref Range   Vit D, 25-Hydroxy 24.53 (L) 30 - 100 ng/mL    Comment: (NOTE) Vitamin D  deficiency has been defined by the Institute of Medicine  and an Endocrine Society practice guideline as a level of serum 25-OH  vitamin D  less than 20 ng/mL (1,2). The Endocrine Society went on to  further define vitamin D  insufficiency as a level between 21 and 29  ng/mL  (2).  1. IOM (Institute of Medicine). 2010. Dietary reference intakes for  calcium and D. [place]: [employer]. 2. Holick MF, [facility], Bischoff-Ferrari HA, et al. Evaluation,  treatment, and prevention of vitamin D  deficiency: an Endocrine  Society clinical practice guideline, JCEM. 2011 Jul; 96(7): 1911-30.  Performed at [facility], [address] [place], [place] [address]     Blood Alcohol level:  Lab Results  Component Value Date   [facility] <15 10/15/2023    Metabolic Disorder Labs: Lab Results  Component Value Date   HGBA1C 5.8 (H) 10/19/2023   MPG 119.76 10/19/2023   No results found for: PROLACTIN Lab Results  Component Value Date   CHOL 203 (H) 10/19/2023   TRIG 154 (H) 10/19/2023   HDL 64 10/19/2023   CHOLHDL 3.2 10/19/2023   VLDL 31 10/19/2023   LDLCALC 108 (H) 10/19/2023   LDLCALC 71 02/26/2012    Physical Findings: AIMS:  , ,  ,  ,    CIWA:    COWS:     Psychiatric Specialty Exam:  Presentation  General Appearance: Casual; Fairly Groomed  Eye Contact: Fair  Speech: Clear and Coherent; Normal Rate  Speech Volume: Normal  Handedness: Right   Mood and Affect  Mood: Depressed; Anxious  Affect: Non-Congruent   Thought Process  Thought Processes: Coherent; Goal Directed; Linear  Descriptions of Associations: Intact  Orientation: Full (Time, Place and Person)  Thought Content: Logical  History  of Schizophrenia/Schizoaffective disorder: No  Duration of Psychotic Symptoms: NA Hallucinations: Hallucinations: Auditory Description of Auditory Hallucinations: Come out.   Ideas of Reference: None  Suicidal Thoughts: Suicidal Thoughts: No   Homicidal Thoughts: Homicidal Thoughts: No    Sensorium  Memory: Immediate Good; Recent Good; Remote Good  Judgment: Fair  Insight: Fair   Art therapist  Concentration: Good  Attention Span: Good  Recall: Good  Fund of Knowledge:  Fair  Language: Good   Psychomotor Activity  Psychomotor Activity: Psychomotor Activity: Normal    Assets  Assets: Communication Skills; Desire for Improvement; Housing; Social Support   Sleep  Sleep: Sleep: Fair Number of Hours of Sleep: 5.5    Musculoskeletal: Strength & Muscle Tone: within normal limits Gait & Station: normal Patient leans: N/A   Physical Exam: General: Sitting comfortably. NAD. HEENT: Normocephalic, atraumatic, MMM, EMOI Lungs: no increased work of breathing noted Heart: no cyanosis Abdomen: Non distended Musculoskeletal: FROM. No obvious deformities Skin: Warm, dry, intact. No rashes noted Neuro: No obvious focal deficits.  Gait and station are normal  Review of Systems  Constitutional: Negative.   HENT: Negative.    Eyes: Negative.   Respiratory: Negative.    Cardiovascular: Negative.   Gastrointestinal: Negative.   Genitourinary: Negative.   Skin: Negative.   Neurological: Negative.   Psychiatric/Behavioral:  Positive for auditory hallucinations, depressed mood.     Blood pressure 123/77, pulse 86, temperature 98 F (36.7 C), temperature source Oral, resp. rate 18, height 5' 11 (1.803 m), weight 75.8 kg, SpO2 100%. Body mass index is 23.29 kg/m.  ASSESSMENT: Frank Stark is an 43 y.o. male who  has a past medical history of Anxiety, Depression, and HIV (human immunodeficiency virus infection) (HCC).  He presented on 10/16/2023 11:41 AM for Severe recurrent major depressive disorder with psychotic features (HCC).    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   Papanicolaou smear of anus with atypical squamous cells of undetermined significance (ASC-US ) 02/27/2012     Scheduled Medications:  ARIPiprazole   15 mg Oral QHS   bictegravir-emtricitabine -tenofovir  AF  1 tablet Oral Daily   [  START ON 10/21/2023] DULoxetine   60 mg Oral Daily   gabapentin   300 mg Oral Daily   lidocaine   1 patch Transdermal Q24H   naproxen   250 mg Oral TID WC   nicotine   14 mg Transdermal Daily   traZODone   200 mg Oral QHS   Vitamin D  (Ergocalciferol )  50,000 Units Oral BID    As Needed Medications: acetaminophen , albuterol , alum & mag hydroxide-simeth, benzocaine , haloperidol  **AND** diphenhydrAMINE , haloperidol  lactate **AND** diphenhydrAMINE  **AND** LORazepam , haloperidol  lactate **AND** diphenhydrAMINE  **AND** LORazepam , magnesium  hydroxide, nicotine  polacrilex, zolpidem     3.  Medical Issues Being Addressed:   -- No significant medical problems  Labs reviewed, unremarkable   Tobacco Use Disorder  -- Nicotine  replacement as above  -- Smoking cessation encouraged  4. Discharge Planning:   -- Social work and case management to assist with discharge planning and identification of hospital follow-up needs prior to discharge  -- Estimated LOS: Likely discharge early next week  -- Discharge Concerns: Need to establish a safety plan; Medication compliance and effectiveness  -- Discharge Goals: Return home with outpatient referrals for mental health follow-up including medication management/psychotherapy  5. Short Term Goals:  Improve ability to identify changes in lifestyle to reduce recurrence of condition, verbalize feelings, disclose and discuss suicidal ideas, demonstrate self-control, identify and develop effective coping behaviors, compliance with prescribed medications, identify triggers associated with substance abuse/mental health issues, participate in unit milieu and in scheduled group therapies   6. Long Term Goals: Improvement in symptoms so the patient is ready for discharge   --The risks/benefits/side-effects/alternatives to the medications above were discussed in detail with the patient and time was given for questions. The patient provided informed consent.   -- Metabolic profile and EKG monitoring obtained while on an atypical antipsychotic and listed in the EHR    Total Time Spent in Direct Patient Care:  I personally spent 35 minutes on the unit in direct patient care. The direct patient care time included face-to-face time with the patient, reviewing the patient's chart, communicating with other professionals, and coordinating care. Greater than 50% of this time was spent in counseling or coordinating care with the patient regarding goals of hospitalization, psycho-education, and discharge planning needs.      Glendia Kitty, MD Psychiatrist   10/20/2023, 2:55 PM   I certify that inpatient services furnished can reasonably be expected to improve the patient's condition.    Portions of this note were created using voice recognition software. Minor syntax errors, grammatical content, spelling, or punctuation errors may have occurred unintentionally. Please notify the dino if the meaning of any statement is unclear.

## 2023-10-20 NOTE — Group Note (Signed)
 Date:  10/20/2023 Time:  11:46 AM  Group Topic/Focus:  Goals Group:   The focus of this group is to help patients establish daily goals to achieve during treatment and discuss how the patient can incorporate goal setting into their daily lives to aide in recovery.    Participation Level:  Pt Did Not Attend  Participation Quality:  Pt Did Not Attend  Affect:  Pt Did Not Attend  Cognitive:  Pt Did Not Attend  Insight: Pt Did Not Attend  Engagement in Group:  Pt Did Not Attend  Modes of Intervention:  Pt Did Not Attend  Additional Comments:  Pt Did Not Attend  Jaeleah Smyser E Jencarlo Bonadonna 10/20/2023, 11:46 AM

## 2023-10-20 NOTE — Progress Notes (Signed)
(  Sleep Hours) -7.0 (Any PRNs that were needed, meds refused, or side effects to meds)- prn tylenol  @ 1937 (Any disturbances and when (visitation, over night)-none (Concerns raised by the patient)- none (SI/HI/AVH)- denies all  Pt requested tylenol  for upper abd. Pain, rating it 10/10 stating that earlier today it was onlt upper quadrant and now it is in rt upper quadrant.

## 2023-10-20 NOTE — Group Note (Signed)
 Date:  10/20/2023 Time:  5:48 PM  Group Topic/Focus:  Coping With Mental Health Crisis:   The purpose of this group is to help patients identify strategies for coping with mental health crisis.  Group discusses possible causes of crisis and ways to manage them effectively. Identifying Needs:   The focus of this group is to help patients identify their personal needs that have been historically problematic and identify healthy behaviors to address their needs. Wellness Toolbox:   The focus of this group is to discuss various aspects of wellness, balancing those aspects and exploring ways to increase the ability to experience wellness.  Patients will create a wellness toolbox for use upon discharge.    Participation Level:  Active  Participation Quality:  Appropriate  Affect:  Appropriate  Cognitive:  Alert  Insight: Appropriate  Engagement in Group:  Engaged  Modes of Intervention:  Discussion  Additional Comments:    Frank Stark 10/20/2023, 5:48 PM

## 2023-10-20 NOTE — Progress Notes (Signed)
   10/20/23 0900  Psych Admission Type (Psych Patients Only)  Admission Status Voluntary  Psychosocial Assessment  Patient Complaints Anxiety  Eye Contact Fair  Facial Expression Anxious  Affect Anxious  Speech Logical/coherent  Interaction Cautious  Motor Activity Slow  Appearance/Hygiene Unremarkable  Behavior Characteristics Cooperative;Calm  Mood Anxious  Thought Process  Coherency WDL  Content WDL  Delusions None reported or observed  Perception Hallucinations  Hallucination Auditory  Judgment Impaired  Confusion None  Danger to Self  Current suicidal ideation? Denies  Self-Injurious Behavior No self-injurious ideation or behavior indicators observed or expressed   Agreement Not to Harm Self Yes  Description of Agreement verbal  Danger to Others  Danger to Others None reported or observed  Danger to Others Abnormal  Harmful Behavior to others No threats or harm toward other people  Destructive Behavior No threats or harm toward property

## 2023-10-20 NOTE — Plan of Care (Signed)
°  Problem: Health Behavior/Discharge Planning: Goal: Compliance with prescribed medication regimen will improve Outcome: Progressing   Problem: Role Relationship: Goal: Ability to interact with others will improve Outcome: Progressing   Problem: Safety: Goal: Ability to remain free from injury will improve Outcome: Progressing

## 2023-10-20 NOTE — Progress Notes (Signed)
 D) Pt received calm, visible, participating in milieu, and in no acute distress. Pt A & O x4. Pt denies SI, HI, V/ H, depression, anxiety and pain at this time. Pt endorses AH at this time. A) Pt encouraged to drink fluids. Pt encouraged to come to staff with needs. Pt encouraged to attend and participate in groups. Pt encouraged to set reachable goals.  R) Pt remained safe on unit, in no acute distress, will continue to assess.     10/20/23 2000  Psych Admission Type (Psych Patients Only)  Admission Status Voluntary  Psychosocial Assessment  Patient Complaints Anxiety  Eye Contact Fair  Facial Expression Anxious  Affect Anxious  Speech Logical/coherent  Interaction Cautious  Motor Activity Slow  Appearance/Hygiene Unremarkable  Behavior Characteristics Calm;Cooperative  Mood Anxious  Thought Process  Coherency WDL  Content WDL  Delusions None reported or observed  Perception Hallucinations  Hallucination Auditory  Judgment Impaired  Confusion None  Danger to Self  Current suicidal ideation? Denies  Self-Injurious Behavior No self-injurious ideation or behavior indicators observed or expressed   Agreement Not to Harm Self Yes  Description of Agreement verbal  Danger to Others  Danger to Others None reported or observed  Danger to Others Abnormal  Harmful Behavior to others No threats or harm toward other people  Destructive Behavior No threats or harm toward property

## 2023-10-21 LAB — T.PALLIDUM AB, TOTAL: T Pallidum Abs: REACTIVE — AB

## 2023-10-21 MED ORDER — HYDROXYZINE HCL 50 MG PO TABS
50.0000 mg | ORAL_TABLET | Freq: Every day | ORAL | Status: DC
  Administered 2023-10-21 – 2023-10-22 (×2): 50 mg via ORAL
  Filled 2023-10-21 (×2): qty 1

## 2023-10-21 MED ORDER — MAGNESIUM CITRATE PO SOLN
1.0000 | Freq: Once | ORAL | Status: AC
  Administered 2023-10-21: 1 via ORAL
  Filled 2023-10-21: qty 296

## 2023-10-21 MED ORDER — NICOTINE 21 MG/24HR TD PT24
21.0000 mg | MEDICATED_PATCH | Freq: Every day | TRANSDERMAL | Status: DC
  Administered 2023-10-22 – 2023-10-23 (×2): 21 mg via TRANSDERMAL
  Filled 2023-10-21 (×2): qty 1

## 2023-10-21 NOTE — Group Note (Signed)
 Recreation Therapy Group Note   Group Topic:Communication  Group Date: 10/21/2023 Start Time: 0930 End Time: 1000 Facilitators: Tanmay Halteman-McCall, LRT,CTRS Location: [address] Dayroom   Group Topic: Communication, Team Building, Problem Solving  Goal Area(s) Addresses:  Patient will effectively work with peer towards shared goal.  Patient will identify skills used to make activity successful.  Patient will identify how skills used during activity can be applied to reach post d/c goals.   Behavioral Response:   Intervention: STEM Activity- Glass blower/designer  Activity: Tallest [employer]. In teams of 5-6, patients were given 11 craft pipe cleaners. Using the materials provided, patients were instructed to compete again the opposing team(s) to build the tallest free-standing structure from floor level. The activity was timed; difficulty increased by Clinical research associate as Production designer, theatre/television/film continued.  Systematically resources were removed with additional directions for example, placing one arm behind their back, working in silence, and shape stipulations. LRT facilitated post-activity discussion reviewing team processes and necessary communication skills involved in completion. Patients were encouraged to reflect how the skills utilized, or not utilized, in this activity can be incorporated to positively impact support systems post discharge.  Education: Pharmacist, community, Scientist, physiological, Discharge Planning   Education Outcome: Acknowledges education/In group clarification offered/Needs additional education.    Affect/Mood: N/A   Participation Level: Did not attend    Clinical Observations/Individualized Feedback:      Plan: Continue to engage patient in RT group sessions 2-3x/week.   Frank Stark, LRT,CTRS 10/21/2023 12:33 PM

## 2023-10-21 NOTE — Progress Notes (Signed)

## 2023-10-21 NOTE — Group Note (Signed)
 Date:  10/21/2023 Time:  5:09 PM  Group Topic/Focus:  Ask Me 3    Participation Level:  Did Not Attend  Participation Quality:  Did Not Attend  Affect:  Did Not Attend  Cognitive:  Did Not Attend  Insight: Did Not Attend  Engagement in Group:  Did Not Attend  Modes of Intervention:  Did Not Attend  Additional Comments:    Roark [place] 10/21/2023, 5:09 PM

## 2023-10-21 NOTE — Progress Notes (Signed)
   10/21/23 0900  Psych Admission Type (Psych Patients Only)  Admission Status Voluntary  Psychosocial Assessment  Patient Complaints Anxiety  Eye Contact Fair  Facial Expression Anxious  Affect Anxious  Speech Logical/coherent  Interaction Cautious  Motor Activity Slow  Appearance/Hygiene Unremarkable  Behavior Characteristics Cooperative  Mood Pleasant  Thought Process  Coherency WDL  Content WDL  Delusions None reported or observed  Perception WDL  Hallucination None reported or observed  Judgment Impaired  Confusion None  Danger to Self  Current suicidal ideation? Denies  Danger to Others  Danger to Others None reported or observed

## 2023-10-21 NOTE — Plan of Care (Addendum)
(  Sleep Hours) - 3.75 (Any PRNs that were needed, meds refused, or side effects to meds)- tylenol  (Any disturbances and when (visitation, over night)- none (Concerns raised by the patient)- none (SI/HI/AVH)- denies all

## 2023-10-21 NOTE — Progress Notes (Signed)
 [facility] MD Progress Note  10/21/2023 8:27 PM Frank Stark  MRN:  [id number] Principal Problem: Severe recurrent major depressive disorder with psychotic features [facility]) Diagnosis: Principal Problem:   Severe recurrent major depressive disorder with psychotic features [facility]) Active Problems:   Hallucinations   Reason for Admission: Frank Stark is a 43 year old with history of anxiety, depression and HIV, admitted to [facility] after presenting to [facility] ED with worsening depressive symptoms in the context of auditory and tactile hallucinations ongoing for the past month.   24-hour chart review: Chart reviewed. Patient's case discussed in interdisciplinary team meeting.  Vital signs reviewed without critical value. No as needed psychotropic medication required overnight.  He slept a documented 3.75 hours.  He is adherent to psychotropic medication regimen. Nursing notes indicate no behavioral challenges on the unit.   Daily Evaluation: Frank Stark was seen in the interview room during rounds.        Collateral:      Past Psychiatric and Medical Medical History:  Past Medical History:  Diagnosis Date   Anxiety    Depression    HIV (human immunodeficiency virus infection) (HCC)     History reviewed. No pertinent surgical history.  Family History(Medical and Psychiatric):  Family History  Problem Relation Age of Onset   Hypertension Mother    Hypertension Father    Asthma Sister    Diabetes Maternal Grandmother    Dementia Maternal Grandmother    Glaucoma Maternal Grandfather        Social History:  Social History   Substance and Sexual Activity  Alcohol Use Not Currently   Comment: rarely     Social History   Substance and Sexual Activity  Drug Use Not Currently   Comment: occasional    Social History   Socioeconomic History   Marital status: Single    Spouse name: Not on file   Number of children: Not on file   Years of education:  Not on file   Highest education level: Not on file  Occupational History   Not on file  Tobacco Use   Smoking status: Every Day    Current packs/day: 0.00    Types: Cigarettes   Smokeless tobacco: Never  Vaping Use   Vaping status: Every Day  Substance and Sexual Activity   Alcohol use: Not Currently    Comment: rarely   Drug use: Not Currently    Comment: occasional   Sexual activity: Yes    Partners: Male    Comment: pt given condoms  Other Topics Concern   Not on file  Social History Narrative   Not on file   Social Drivers of Health   Financial Resource Strain: Not on file  Food Insecurity: No Food Insecurity (10/16/2023)   Hunger Vital Sign    Worried About Running Out of Food in the Last Year: Never true    Ran Out of Food in the Last Year: Never true  Transportation Needs: No Transportation Needs (10/16/2023)   PRAPARE - Administrator, Civil Service (Medical): No    Lack of Transportation (Non-Medical): No  Physical Activity: Not on file  Stress: Not on file  Social Connections: Unknown (06/15/2021)   [facility]   Social Network    Social Network: Not on file        Current Medications: Current Facility-Administered Medications  Medication Dose Route Frequency Provider Last Rate Last Admin   acetaminophen  (TYLENOL ) tablet 650 mg  650  mg Oral Q6H PRN Weber, Kyra A, NP   650 mg at 10/20/23 2108   albuterol  (VENTOLIN  HFA) 108 (90 Base) MCG/ACT inhaler 1-2 puff  1-2 puff Inhalation Q6H PRN Bobbitt, Shalon E, NP   2 puff at 10/19/23 0813   alum & mag hydroxide-simeth (MAALOX/MYLANTA) 200-200-20 MG/5ML suspension 30 mL  30 mL Oral Q4H PRN Weber, Kyra A, NP       ARIPiprazole  (ABILIFY ) tablet 15 mg  15 mg Oral QHS Parker, Alvin S, MD   15 mg at 10/20/23 2109   benzocaine  (ORAJEL) 10 % mucosal gel   Mouth/Throat BID PRN Bobbitt, Shalon E, NP   Given at 10/20/23 1627   bictegravir-emtricitabine -tenofovir  AF (BIKTARVY ) 50-200-25 MG per  tablet 1 tablet  1 tablet Oral Daily Weber, Kyra A, NP   1 tablet at 10/21/23 0806   haloperidol  (HALDOL ) tablet 5 mg  5 mg Oral TID PRN Weber, Kyra A, NP   5 mg at 10/16/23 1533   And   diphenhydrAMINE  (BENADRYL ) capsule 50 mg  50 mg Oral TID PRN Weber, Kyra A, NP   50 mg at 10/16/23 1533   haloperidol  lactate (HALDOL ) injection 5 mg  5 mg Intramuscular TID PRN Weber, Kyra A, NP       And   diphenhydrAMINE  (BENADRYL ) injection 50 mg  50 mg Intramuscular TID PRN Weber, Kyra A, NP       And   LORazepam  (ATIVAN ) injection 2 mg  2 mg Intramuscular TID PRN Weber, Kyra A, NP       haloperidol  lactate (HALDOL ) injection 10 mg  10 mg Intramuscular TID PRN Weber, Kyra A, NP       And   diphenhydrAMINE  (BENADRYL ) injection 50 mg  50 mg Intramuscular TID PRN Weber, Kyra A, NP       And   LORazepam  (ATIVAN ) injection 2 mg  2 mg Intramuscular TID PRN Weber, Kyra A, NP       DULoxetine  (CYMBALTA ) DR capsule 60 mg  60 mg Oral Daily Parker, Alvin S, MD   60 mg at 10/21/23 9193   gabapentin  (NEURONTIN ) capsule 300 mg  300 mg Oral Daily Weber, Kyra A, NP   300 mg at 10/21/23 0806   lidocaine  (LIDODERM ) 5 % 1 patch  1 patch Transdermal Q24H Parker, Alvin S, MD   1 patch at 10/21/23 1233   magnesium  hydroxide (MILK OF MAGNESIA) suspension 30 mL  30 mL Oral Daily PRN Weber, Kyra A, NP       naproxen  (NAPROSYN ) tablet 250 mg  250 mg Oral TID WC Weber, Kyra A, NP   250 mg at 10/21/23 1801   [START ON 10/22/2023] nicotine  (NICODERM CQ  - dosed in mg/24 hours) patch 21 mg  21 mg Transdermal Daily Orvile Corona H, NP       nicotine  polacrilex (NICORETTE ) gum 2 mg  2 mg Oral PRN Parker, Alvin S, MD   2 mg at 10/21/23 1803   traZODone  (DESYREL ) tablet 200 mg  200 mg Oral QHS Parker, Alvin S, MD   200 mg at 10/20/23 2109   Vitamin D  (Ergocalciferol ) (DRISDOL ) 1.25 MG (50000 UNIT) capsule 50,000 Units  50,000 Units Oral BID Parker, Alvin S, MD   50,000 Units at 10/21/23 1801   zolpidem  (AMBIEN ) tablet 10 mg  10 mg Oral  QHS PRN Kennyth Starleen RAMAN, MD        Lab Results:  No results found for this or any previous visit (from the past 48  hours).   Blood Alcohol level:  Lab Results  Component Value Date   [facility] <15 10/15/2023    Metabolic Disorder Labs: Lab Results  Component Value Date   HGBA1C 5.8 (H) 10/19/2023   MPG 119.76 10/19/2023   No results found for: PROLACTIN Lab Results  Component Value Date   CHOL 203 (H) 10/19/2023   TRIG 154 (H) 10/19/2023   HDL 64 10/19/2023   CHOLHDL 3.2 10/19/2023   VLDL 31 10/19/2023   LDLCALC 108 (H) 10/19/2023   LDLCALC 71 02/26/2012    Physical Findings: AIMS:  , ,  ,  ,    CIWA:    COWS:     Psychiatric Specialty Exam:  Presentation  General Appearance: Casual; Fairly Groomed  Eye Contact: Fair  Speech: Clear and Coherent; Normal Rate  Speech Volume: Normal  Handedness: Right   Mood and Affect  Mood: Depressed; Anxious  Affect: Non-Congruent   Thought Process  Thought Processes: Coherent; Goal Directed; Linear  Descriptions of Associations: Intact  Orientation: Full (Time, Place and Person)  Thought Content: Logical  History of Schizophrenia/Schizoaffective disorder: No  Duration of Psychotic Symptoms: NA Hallucinations: Hallucinations: Auditory Description of Auditory Hallucinations: Come out.   Ideas of Reference: None  Suicidal Thoughts: Suicidal Thoughts: No   Homicidal Thoughts: Homicidal Thoughts: No    Sensorium  Memory: Immediate Good; Recent Good; Remote Good  Judgment: Fair  Insight: Fair   Art therapist  Concentration: Good  Attention Span: Good  Recall: Good  Fund of Knowledge: Fair  Language: Good   Psychomotor Activity  Psychomotor Activity: Psychomotor Activity: Normal    Assets  Assets: Communication Skills; Desire for Improvement; Housing; Social Support   Sleep  Sleep: Sleep: Fair Number of Hours of Sleep: 5.5    Musculoskeletal: Strength & Muscle Tone:  within normal limits Gait & Station: normal Patient leans: N/A   Physical Exam: General: Sitting comfortably. NAD. HEENT: Normocephalic, atraumatic, MMM, EMOI Lungs: no increased work of breathing noted Heart: no cyanosis Abdomen: Non distended Musculoskeletal: FROM. No obvious deformities Skin: Warm, dry, intact. No rashes noted Neuro: No obvious focal deficits.  Gait and station are normal  Review of Systems  Constitutional: Negative.   HENT: Negative.    Eyes: Negative.   Respiratory: Negative.    Cardiovascular: Negative.   Gastrointestinal: Negative.   Genitourinary: Negative.   Skin: Negative.   Neurological: Negative.   Psychiatric/Behavioral:  Positive for auditory hallucinations, depressed mood.     Blood pressure 126/80, pulse 70, temperature 98.7 F (37.1 C), temperature source Oral, resp. rate 20, height 5' 11 (1.803 m), weight 75.8 kg, SpO2 97%. Body mass index is 23.29 kg/m.   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   Papanicolaou smear of anus with atypical squamous cells of undetermined significance (ASC-US ) 02/27/2012     Scheduled Medications:  ARIPiprazole   15 mg Oral QHS   bictegravir-emtricitabine -tenofovir  AF  1 tablet Oral Daily   DULoxetine   60 mg Oral Daily   gabapentin   300 mg Oral Daily   lidocaine   1 patch Transdermal Q24H   naproxen   250 mg Oral TID WC   [START ON 10/22/2023] nicotine   21 mg Transdermal Daily   traZODone   200 mg Oral QHS   Vitamin D  (Ergocalciferol )  50,000 Units Oral BID    As Needed Medications: acetaminophen , albuterol , alum & mag hydroxide-simeth, benzocaine , haloperidol  **AND** diphenhydrAMINE , haloperidol  lactate **AND** diphenhydrAMINE  **AND** LORazepam , haloperidol  lactate **AND** diphenhydrAMINE  **AND** LORazepam , magnesium  hydroxide, nicotine  polacrilex, zolpidem     3. Medical Issues Being Addressed:   -- HIV  Labs reviewed, unremarkable   Tobacco Use Disorder  -- Nicotine  replacement as above  -- Smoking cessation encouraged  4. Discharge Planning:   -- Social work and case management to assist with discharge planning and identification of hospital follow-up needs prior to discharge  -- Estimated LOS: Likely discharge early next week  -- Discharge Concerns: Need to establish a safety plan; Medication compliance and effectiveness  -- Discharge Goals: Return home with outpatient referrals for mental health follow-up including  medication management/psychotherapy  5. Short Term Goals:  Improve ability to identify changes in lifestyle to reduce recurrence of condition, verbalize feelings, disclose and discuss suicidal ideas, demonstrate self-control, identify and develop effective coping behaviors, compliance with prescribed medications, identify triggers associated with substance abuse/mental health issues, participate in unit milieu and in scheduled group therapies   6. Long Term Goals: Improvement in symptoms so the patient is ready for discharge   --The risks/benefits/side-effects/alternatives to the medications above were discussed in detail with the patient and time was given for questions. The patient provided informed consent.   -- Metabolic profile and EKG monitoring obtained while on an atypical antipsychotic and listed in the EHR   I personally spent a total of 45 minutes in the care of the patient today including preparing to see the patient, getting/reviewing separately obtained history, counseling and educating, placing orders, documenting clinical information in the EHR, and coordinating care.    I certify that inpatient services furnished can reasonably be expected to improve the patient's condition.   Patient ID: Frank Stark, male   DOB: 01-04-1981, 42 y.o.   MRN: [id number]

## 2023-10-21 NOTE — Group Note (Signed)
 Date:  10/21/2023 Time:  9:42 PM  Group Topic/Focus:  Wrap-Up Group:   The focus of this group is to help patients review their daily goal of treatment and discuss progress on daily workbooks.    Participation Level:  Active  Participation Quality:  Appropriate  Affect:  Appropriate  Cognitive:  Appropriate  Insight: Appropriate  Engagement in Group:  Engaged  Modes of Intervention:  Activity  Additional Comments:    Leigh VEAR Pais 10/21/2023, 9:42 PM

## 2023-10-21 NOTE — Plan of Care (Signed)
  Problem: Safety: Goal: Ability to remain free from injury will improve Outcome: Progressing   Problem: Self-Care: Goal: Ability to participate in self-care as condition permits will improve Outcome: Progressing

## 2023-10-21 NOTE — Progress Notes (Signed)
(  Sleep Hours) - (Any PRNs that were needed, meds refused, or side effects to meds)- Ambien  (Any disturbances and when (visitation, over night) (Concerns raised by the patient)- denies (SI/HI/AVH)- none

## 2023-10-22 MED ORDER — ZOLPIDEM TARTRATE 5 MG PO TABS: 10.0000 mg | ORAL_TABLET | Freq: Every evening | ORAL | Status: DC | PRN

## 2023-10-22 MED ORDER — ARIPIPRAZOLE 10 MG PO TABS
20.0000 mg | ORAL_TABLET | Freq: Every day | ORAL | Status: DC
  Administered 2023-10-22: 20 mg via ORAL
  Filled 2023-10-22: qty 2

## 2023-10-22 MED ORDER — DOXYCYCLINE HYCLATE 100 MG PO TABS
100.0000 mg | ORAL_TABLET | Freq: Two times a day (BID) | ORAL | Status: DC
  Administered 2023-10-22 – 2023-10-23 (×3): 100 mg via ORAL
  Filled 2023-10-22 (×3): qty 1

## 2023-10-22 NOTE — Group Note (Signed)
 Date:  10/22/2023 Time:  5:13 PM  Group Topic/Focus:  Emotional Education:   The focus of this group is to discuss what feelings/emotions are, and how they are experienced.    Participation Level:  Active  Participation Quality:  Appropriate  Affect:  Appropriate  Cognitive:  Appropriate  Insight: Appropriate  Engagement in Group:  Engaged  Modes of Intervention:  Discussion   Annalee  Leni Pankonin 10/22/2023, 5:13 PM

## 2023-10-22 NOTE — Group Note (Signed)
 Recreation Therapy Group Note   Group Topic:Animal Assisted Therapy   Group Date: 10/22/2023 Start Time: 0947 End Time: 1030 Facilitators: Lavayah Vita-McCall, LRT,CTRS Location: [address] Dayroom   Animal-Assisted Activity (AAA) Program Checklist/Progress Notes Patient Eligibility Criteria Checklist & Daily Group note for Rec Tx Intervention  AAA/T Program Assumption of Risk Form signed by Patient/ or Parent Legal Guardian Yes  Patient understands his/her participation is voluntary Yes  Behavioral Response:    Education: Charity fundraiser, Appropriate Animal Interaction   Education Outcome: Acknowledges education.    Affect/Mood: N/A   Participation Level: Did not attend    Clinical Observations/Individualized Feedback:      Plan: Continue to engage patient in RT group sessions 2-3x/week.   Lemon Sternberg-McCall, LRT,CTRS 10/22/2023 12:21 PM

## 2023-10-22 NOTE — Plan of Care (Signed)
  Problem: Activity: Goal: Will verbalize the importance of balancing activity with adequate rest periods Outcome: Progressing   Problem: Education: Goal: Will be free of psychotic symptoms Outcome: Progressing Goal: Knowledge of the prescribed therapeutic regimen will improve Outcome: Progressing   Problem: Coping: Goal: Coping ability will improve Outcome: Progressing Goal: Will verbalize feelings Outcome: Progressing   Problem: Health Behavior/Discharge Planning: Goal: Compliance with prescribed medication regimen will improve Outcome: Progressing   Problem: Nutritional: Goal: Ability to achieve adequate nutritional intake will improve Outcome: Progressing   Problem: Role Relationship: Goal: Ability to communicate needs accurately will improve Outcome: Progressing Goal: Ability to interact with others will improve Outcome: Progressing   Problem: Safety: Goal: Ability to redirect hostility and anger into socially appropriate behaviors will improve Outcome: Progressing Goal: Ability to remain free from injury will improve Outcome: Progressing   Problem: Self-Care: Goal: Ability to participate in self-care as condition permits will improve Outcome: Progressing   Problem: Self-Concept: Goal: Will verbalize positive feelings about self Outcome: Progressing

## 2023-10-22 NOTE — Plan of Care (Signed)
  Problem: Coping: Goal: Coping ability will improve Outcome: Progressing   Problem: Health Behavior/Discharge Planning: Goal: Compliance with prescribed medication regimen will improve Outcome: Progressing

## 2023-10-22 NOTE — Progress Notes (Signed)
 [facility] MD Progress Note  10/22/2023 10:46 PM Frank Stark  MRN:  [id number] Principal Problem: Severe recurrent major depressive disorder with psychotic features [facility]) Diagnosis: Principal Problem:   Severe recurrent major depressive disorder with psychotic features (HCC) Active Problems:   Hallucinations   Reason for Admission: Frank Stark is a 43 year old with history of anxiety, depression and HIV, admitted to [facility] after presenting to [facility] ED with worsening depressive symptoms in the context of auditory and tactile hallucinations ongoing for the past month.   24-hour chart review: Chart reviewed. Patient's case discussed in interdisciplinary team meeting.  Vital signs reviewed without critical value. As needed Ambien  required overnight.  Subsequently, he slept a documented 7.25 hours.  He is adherent to psychotropic medication regimen. Nursing notes indicate no behavioral challenges on the unit.   Daily Evaluation: Tarvis was seen in the interview room during rounds.  He reports       [name] is polite and cooperative. He continues to endorse auditory hallucinations Optimize Abilify  to 20 mg nightly. Discontinue Ambien . Anticipated discharge date Thursday October 23, 2023.    10/21/2023 - Collateral Information: Donnell Wion (mom) [phone].  Spoke with the patient's mother, who reports that the patient may not have slept well last night, potentially due to sleeping during the day yesterday. She reported that when she spoke with him by phone the previous day around 3:00 PM, he stated that he was going to lie down. She explains that at home, if he does not sleep at night, he tends to sleep during the day. She attributes his nighttime sleep difficulties to auditory hallucinations, which causes his agitation. Patient's mother reported she spoke with him this morning and noted that he sounded slightly groggy. She reports that while these episodes  have occurred in the past, they have become more frequent and longer over the past month. She expressed hope that he stabilizes during his current hospitalization, as she is unsure how willing he will be to return for future care. She also stated that she visited him two days ago and observed some improvement, although he continues to experience auditory hallucinations.    Past Psychiatric and Medical Medical History:  Past Medical History:  Diagnosis Date   Anxiety    Depression    HIV (human immunodeficiency virus infection) (HCC)     History reviewed. No pertinent surgical history.  Family History(Medical and Psychiatric):  Family History  Problem Relation Age of Onset   Hypertension Mother    Hypertension Father    Asthma Sister    Diabetes Maternal Grandmother    Dementia Maternal Grandmother    Glaucoma Maternal Grandfather        Social History:  Social History   Substance and Sexual Activity  Alcohol Use Not Currently   Comment: rarely     Social History   Substance and Sexual Activity  Drug Use Not Currently   Comment: occasional    Social History   Socioeconomic History   Marital status: Single    Spouse name: Not on file   Number of children: Not on file   Years of education: Not on file   Highest education level: Not on file  Occupational History   Not on file  Tobacco Use   Smoking status: Every Day    Current packs/day: 0.00    Types: Cigarettes   Smokeless tobacco: Never  Vaping Use   Vaping status: Every Day  Substance and Sexual Activity   Alcohol  use: Not Currently    Comment: rarely   Drug use: Not Currently    Comment: occasional   Sexual activity: Yes    Partners: Male    Comment: pt given condoms  Other Topics Concern   Not on file  Social History Narrative   Not on file   Social Drivers of Health   Financial Resource Strain: Not on file  Food Insecurity: No Food Insecurity (10/16/2023)   Hunger Vital Sign    Worried  About Running Out of Food in the Last Year: Never true    Ran Out of Food in the Last Year: Never true  Transportation Needs: No Transportation Needs (10/16/2023)   PRAPARE - Administrator, Civil Service (Medical): No    Lack of Transportation (Non-Medical): No  Physical Activity: Not on file  Stress: Not on file  Social Connections: Unknown (06/15/2021)   [facility]   Social Network    Social Network: Not on file        Current Medications: Current Facility-Administered Medications  Medication Dose Route Frequency Provider Last Rate Last Admin   acetaminophen  (TYLENOL ) tablet 650 mg  650 mg Oral Q6H PRN Weber, Kyra A, NP   650 mg at 10/20/23 2108   albuterol  (VENTOLIN  HFA) 108 (90 Base) MCG/ACT inhaler 1-2 puff  1-2 puff Inhalation Q6H PRN Bobbitt, Shalon E, NP   2 puff at 10/19/23 0813   alum & mag hydroxide-simeth (MAALOX/MYLANTA) 200-200-20 MG/5ML suspension 30 mL  30 mL Oral Q4H PRN Weber, Kyra A, NP       ARIPiprazole  (ABILIFY ) tablet 20 mg  20 mg Oral QHS Neria Procter H, NP   20 mg at 10/22/23 2034   benzocaine  (ORAJEL) 10 % mucosal gel   Mouth/Throat BID PRN Bobbitt, Shalon E, NP   Given at 10/20/23 1627   bictegravir-emtricitabine -tenofovir  AF (BIKTARVY ) 50-200-25 MG per tablet 1 tablet  1 tablet Oral Daily Weber, Kyra A, NP   1 tablet at 10/22/23 0914   haloperidol  (HALDOL ) tablet 5 mg  5 mg Oral TID PRN Weber, Kyra A, NP   5 mg at 10/16/23 1533   And   diphenhydrAMINE  (BENADRYL ) capsule 50 mg  50 mg Oral TID PRN Weber, Kyra A, NP   50 mg at 10/16/23 1533   haloperidol  lactate (HALDOL ) injection 5 mg  5 mg Intramuscular TID PRN Weber, Kyra A, NP       And   diphenhydrAMINE  (BENADRYL ) injection 50 mg  50 mg Intramuscular TID PRN Weber, Kyra A, NP       And   LORazepam  (ATIVAN ) injection 2 mg  2 mg Intramuscular TID PRN Weber, Kyra A, NP       haloperidol  lactate (HALDOL ) injection 10 mg  10 mg Intramuscular TID PRN Weber, Kyra A, NP        And   diphenhydrAMINE  (BENADRYL ) injection 50 mg  50 mg Intramuscular TID PRN Weber, Kyra A, NP       And   LORazepam  (ATIVAN ) injection 2 mg  2 mg Intramuscular TID PRN Weber, Kyra A, NP       doxycycline  (VIBRA -TABS) tablet 100 mg  100 mg Oral Q12H Breya Cass H, NP   100 mg at 10/22/23 2034   DULoxetine  (CYMBALTA ) DR capsule 60 mg  60 mg Oral Daily Parker, Alvin S, MD   60 mg at 10/22/23 9085   gabapentin  (NEURONTIN ) capsule 300 mg  300 mg Oral Daily Weber, Kyra A, NP  300 mg at 10/22/23 0914   hydrOXYzine  (ATARAX ) tablet 50 mg  50 mg Oral QHS Ceilidh Torregrossa H, NP   50 mg at 10/22/23 2033   lidocaine  (LIDODERM ) 5 % 1 patch  1 patch Transdermal Q24H Parker, Alvin S, MD   1 patch at 10/22/23 1248   magnesium  hydroxide (MILK OF MAGNESIA) suspension 30 mL  30 mL Oral Daily PRN Weber, Kyra A, NP       naproxen  (NAPROSYN ) tablet 250 mg  250 mg Oral TID WC Weber, Kyra A, NP   250 mg at 10/22/23 1621   nicotine  (NICODERM CQ  - dosed in mg/24 hours) patch 21 mg  21 mg Transdermal Daily Tareka Jhaveri H, NP   21 mg at 10/22/23 0913   nicotine  polacrilex (NICORETTE ) gum 2 mg  2 mg Oral PRN Parker, Alvin S, MD   2 mg at 10/22/23 1622   traZODone  (DESYREL ) tablet 200 mg  200 mg Oral QHS Parker, Alvin S, MD   200 mg at 10/22/23 2033   Vitamin D  (Ergocalciferol ) (DRISDOL ) 1.25 MG (50000 UNIT) capsule 50,000 Units  50,000 Units Oral BID Parker, Alvin S, MD   50,000 Units at 10/22/23 1622    Lab Results:  No results found for this or any previous visit (from the past 48 hours).   Blood Alcohol level:  Lab Results  Component Value Date   [facility] <15 10/15/2023    Metabolic Disorder Labs: Lab Results  Component Value Date   HGBA1C 5.8 (H) 10/19/2023   MPG 119.76 10/19/2023   No results found for: PROLACTIN Lab Results  Component Value Date   CHOL 203 (H) 10/19/2023   TRIG 154 (H) 10/19/2023   HDL 64 10/19/2023   CHOLHDL 3.2 10/19/2023   VLDL 31 10/19/2023   LDLCALC 108 (H)  10/19/2023   LDLCALC 71 02/26/2012    Physical Findings: AIMS:  , ,  ,  ,    CIWA:    COWS:     Psychiatric Specialty Exam:  Presentation  General Appearance: Casual; Fairly Groomed  Eye Contact: Fair  Speech: Clear and Coherent; Normal Rate  Speech Volume: Normal  Handedness: Right   Mood and Affect  Mood: Depressed; Anxious  Affect: Non-Congruent   Thought Process  Thought Processes: Coherent; Goal Directed; Linear  Descriptions of Associations: Intact  Orientation: Full (Time, Place and Person)  Thought Content: Logical  History of Schizophrenia/Schizoaffective disorder: No  Duration of Psychotic Symptoms: NA Hallucinations: No data recorded   Ideas of Reference: None  Suicidal Thoughts: No data recorded   Homicidal Thoughts: No data recorded    Sensorium  Memory: Immediate Good; Recent Good; Remote Good  Judgment: Fair  Insight: Fair   Art therapist  Concentration: Good  Attention Span: Good  Recall: Good  Fund of Knowledge: Fair  Language: Good   Psychomotor Activity  Psychomotor Activity: No data recorded    Assets  Assets: Communication Skills; Desire for Improvement; Housing; Social Support   Sleep  Sleep: No data recorded    Musculoskeletal: Strength & Muscle Tone: within normal limits Gait & Station: normal Patient leans: N/A   Physical Exam: General: Sitting comfortably. NAD. HEENT: Normocephalic, atraumatic, MMM, EMOI Lungs: no increased work of breathing noted Heart: no cyanosis Abdomen: Non distended Musculoskeletal: FROM. No obvious deformities Skin: Warm, dry, intact. No rashes noted Neuro: No obvious focal deficits.  Gait and station are normal  Review of Systems  Constitutional: Negative.   HENT: Negative.    Eyes: Negative.  Respiratory: Negative.    Cardiovascular: Negative.   Gastrointestinal: Negative.   Genitourinary: Negative.   Skin: Negative.   Neurological: Negative.    Psychiatric/Behavioral:  Positive for auditory hallucinations, depressed mood.     Blood pressure 121/82, pulse 86, temperature 98 F (36.7 C), resp. rate 16, height 5' 11 (1.803 m), weight 75.8 kg, SpO2 98%. Body mass index is 23.29 kg/m.   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   Papanicolaou smear of anus with atypical squamous cells of undetermined significance (ASC-US ) 02/27/2012     Scheduled Medications:  ARIPiprazole    20 mg Oral QHS   bictegravir-emtricitabine -tenofovir  AF  1 tablet Oral Daily   doxycycline   100 mg Oral Q12H   DULoxetine   60 mg Oral Daily   gabapentin   300 mg Oral Daily   hydrOXYzine   50 mg Oral QHS   lidocaine   1 patch Transdermal Q24H   naproxen   250 mg Oral TID WC   nicotine   21 mg Transdermal Daily   traZODone   200 mg Oral QHS   Vitamin D  (Ergocalciferol )  50,000 Units Oral BID    As Needed Medications: acetaminophen , albuterol , alum & mag hydroxide-simeth, benzocaine , haloperidol  **AND** diphenhydrAMINE , haloperidol  lactate **AND** diphenhydrAMINE  **AND** LORazepam , haloperidol  lactate **AND** diphenhydrAMINE  **AND** LORazepam , magnesium  hydroxide, nicotine  polacrilex    3. Medical Issues Being Addressed:   -- HIV  Labs reviewed, unremarkable   Tobacco Use Disorder  -- Nicotine  replacement as above  -- Smoking cessation encouraged  4. Discharge Planning:   -- Social work and case management to assist with discharge planning and identification of hospital follow-up needs prior to discharge  -- Estimated LOS: Likely discharge early next week  -- Discharge Concerns: Need to establish a safety plan; Medication compliance and effectiveness  -- Discharge Goals: Return home with outpatient referrals for mental health follow-up including medication management/psychotherapy  5. Short Term Goals:  Improve ability to identify changes in lifestyle to reduce recurrence of condition, verbalize feelings, disclose and discuss suicidal ideas, demonstrate self-control, identify and develop effective coping behaviors, compliance with prescribed medications, identify triggers associated with substance abuse/mental health issues,  participate in unit milieu and in scheduled group therapies   6. Long Term Goals: Improvement in symptoms so the patient is ready for discharge   --The risks/benefits/side-effects/alternatives to the medications above were discussed in detail with the patient and  time was given for questions. The patient provided informed consent.   -- Metabolic profile and EKG monitoring obtained while on an atypical antipsychotic and listed in the EHR   I personally spent a total of 45 minutes in the care of the patient today including preparing to see the patient, getting/reviewing separately obtained history, counseling and educating, placing orders, documenting clinical information in the EHR, and coordinating care.    I certify that inpatient services furnished can reasonably be expected to improve the patient's condition.   Patient ID: ELIODORO GULLETT, male   DOB: [birth year], 43 y.o.   MRN: [id number] Patient ID: REEVE TURNLEY, male   DOB: 1980/02/24, 43 y.o.   MRN: [id number]

## 2023-10-22 NOTE — Group Note (Signed)
 Date:  10/22/2023 Time:  8:48 PM  Group Topic/Focus:  Wrap-Up Group:   The focus of this group is to help patients review their daily goal of treatment and discuss progress on daily workbooks.    Additional Comments:  Pt was encouraged, but opted out of attending wrap up group this evening.   Frank Stark 10/22/2023, 8:48 PM

## 2023-10-22 NOTE — Group Note (Signed)
 LCSW Group Therapy Note   Group Date: 10/22/2023 Start Time: 1100 End Time: 1200   Participation:  did not attend  Type of Therapy:  Group Therapy  Topic:  Healing From Within: Understanding Our Past, Building Our Future"  Objective:  To help participants understand the impact of early experiences on mental and physical health, with a focus on Adverse Childhood Experiences (ACEs), and to explore ways to build resilience and healing.  Goals: Understand ACEs and Their Impact: Learn how childhood experiences shape mental and physical health. Build Resilience: Develop strategies for overcoming challenges and creating positive change. Promote Healing: Recognize the value of support and the possibility of healing through therapy and self-care.  Summary:  In today's session, we discussed how early experiences, especially ACEs, impact mental and physical health. We explored the effects of stress, abuse, and neglect on brain development and well-being. The group focused on resilience, understanding that healing and positive change are possible with support and self-awareness.  Therapeutic Modalities Used: Psychoeducation: Sharing information about ACEs and their effects. Cognitive Behavioral Therapy (CBT): Helping reframe negative thought patterns. Trauma-Informed Therapy: Creating a safe, supportive space for healing.   Daijha Leggio O Vincient Vanaman, LCSWA 10/22/2023  12:29 PM

## 2023-10-22 NOTE — Progress Notes (Signed)
   10/22/23 1400  Psych Admission Type (Psych Patients Only)  Admission Status Voluntary  Psychosocial Assessment  Patient Complaints Anxiety  Eye Contact Fair  Facial Expression Anxious  Affect Anxious  Speech Logical/coherent  Interaction Cautious  Motor Activity Slow  Appearance/Hygiene Unremarkable  Behavior Characteristics Cooperative  Mood Pleasant  Thought Process  Coherency WDL  Content WDL  Delusions None reported or observed  Perception WDL  Hallucination None reported or observed  Judgment Impaired  Confusion None  Danger to Self  Current suicidal ideation? Denies  Description of Suicide Plan No Plan  Self-Injurious Behavior No self-injurious ideation or behavior indicators observed or expressed   Agreement Not to Harm Self Yes  Description of Agreement Verbal Contract  Danger to Others  Danger to Others None reported or observed

## 2023-10-23 MED ORDER — DOXYCYCLINE HYCLATE 100 MG PO TABS: 100.0000 mg | ORAL_TABLET | Freq: Two times a day (BID) | ORAL | 0 refills | Status: AC

## 2023-10-23 MED ORDER — TRAZODONE HCL 100 MG PO TABS: 200.0000 mg | ORAL_TABLET | Freq: Every day | ORAL | 0 refills | Status: AC

## 2023-10-23 MED ORDER — NICOTINE 21 MG/24HR TD PT24: 21.0000 mg | MEDICATED_PATCH | Freq: Every day | TRANSDERMAL | Status: AC

## 2023-10-23 MED ORDER — VITAMIN D (ERGOCALCIFEROL) 1.25 MG (50000 UNIT) PO CAPS: 50000.0000 [IU] | ORAL_CAPSULE | Freq: Two times a day (BID) | ORAL | 0 refills | Status: AC

## 2023-10-23 MED ORDER — GABAPENTIN 300 MG PO CAPS: 300.0000 mg | ORAL_CAPSULE | Freq: Every day | ORAL | 0 refills | Status: AC

## 2023-10-23 MED ORDER — LIDOCAINE 5 % EX PTCH: 1.0000 | MEDICATED_PATCH | CUTANEOUS | 0 refills | Status: AC

## 2023-10-23 MED ORDER — DULOXETINE HCL 60 MG PO CPEP: 60.0000 mg | ORAL_CAPSULE | Freq: Every day | ORAL | 0 refills | Status: AC

## 2023-10-23 MED ORDER — BENZOCAINE 10 % MT GEL: Freq: Two times a day (BID) | OROMUCOSAL | Status: AC | PRN

## 2023-10-23 MED ORDER — HYDROXYZINE HCL 50 MG PO TABS: 50.0000 mg | ORAL_TABLET | Freq: Every day | ORAL | 0 refills | Status: AC

## 2023-10-23 MED ORDER — NICOTINE POLACRILEX 2 MG MT GUM: 2.0000 mg | CHEWING_GUM | OROMUCOSAL | Status: AC | PRN

## 2023-10-23 MED ORDER — ARIPIPRAZOLE 20 MG PO TABS: 20.0000 mg | ORAL_TABLET | Freq: Every day | ORAL | 0 refills | Status: AC

## 2023-10-23 NOTE — Progress Notes (Signed)
(  Sleep Hours) -7.5 (Any PRNs that were needed, meds refused, or side effects to meds)- None  (Any disturbances and when (visitation, over night)-None  (Concerns raised by the patient)-  (SI/HI/AVH)- Denied all

## 2023-10-23 NOTE — [facility] Suicide Risk Assessment (Signed)
 Suicide Risk Assessment  Discharge Assessment    [facility] Discharge Suicide Risk Assessment   Principal Problem: Severe recurrent major depressive disorder with psychotic features [facility]) Discharge Diagnoses: Principal Problem:   Severe recurrent major depressive disorder with psychotic features (HCC) Active Problems:   Hallucinations   Total Time spent with patient: 30 minutes  Musculoskeletal: Strength & Muscle Tone: within normal limits Gait & Station: normal Patient leans: N/A  Psychiatric Specialty Exam  Presentation  General Appearance:  Casual  Eye Contact: Good  Speech: Clear and Coherent; Normal Rate  Speech Volume: Normal  Handedness: Right   Mood and Affect  Mood: Euthymic  Duration of Depression Symptoms: No data recorded Affect: Congruent   Thought Process  Thought Processes: Coherent; Linear  Descriptions of Associations:Intact  Orientation:Full (Time, Place and Person)  Thought Content:WDL  History of Schizophrenia/Schizoaffective disorder:No  Duration of Psychotic Symptoms:N/A  Hallucinations:Hallucinations: None Description of Auditory Hallucinations: -- (Denies)  Ideas of Reference:None  Suicidal Thoughts:Suicidal Thoughts: No  Homicidal Thoughts:Homicidal Thoughts: No   Sensorium  Memory: Immediate Good; Recent Good; Remote Good  Judgment: Intact  Insight: Present   Executive Functions  Concentration: Good  Attention Span: Good  Recall: Good  Fund of Knowledge: Good  Language: Good   Psychomotor Activity  Psychomotor Activity:Psychomotor Activity: Normal   Assets  Assets: Communication Skills; Desire for Improvement; Housing; Resilience; Social Support   Sleep  Sleep:Sleep: Good  Estimated Sleeping Duration (Last 24 Hours): 8.00-10.00 hours  Physical Exam: Physical Exam ROS Blood pressure 121/82, pulse 86, temperature 98 F (36.7 C), resp. rate 16, height 5' 11 (1.803 m), weight 75.8  kg, SpO2 98%. Body mass index is 23.29 kg/m.  Mental Status Per Nursing Assessment::   On Admission:  NA  Demographic Factors:  Male  Loss Factors: NA  Historical Factors: NA  Risk Reduction Factors:   Religious beliefs about death, Living with another person, especially a relative, Positive social support, Positive therapeutic relationship, and Positive coping skills or problem solving skills  Continued Clinical Symptoms:  More than one psychiatric diagnosis Previous Psychiatric Diagnoses and Treatments Medical Diagnoses and Treatments/Surgeries  Cognitive Features That Contribute To Risk:  None    Suicide Risk:  Minimal: No identifiable suicidal ideation.  Patients presenting with no risk factors but with morbid ruminations; may be classified as minimal risk based on the severity of the depressive symptoms.  Patient is future oriented.  No overt psychotic symptoms.  No manic features.  Presently not a risk to himself or others.  Stable for lower level of care.   Follow-up Information     [facility] Go on 10/29/2023.   Why: You have an appointment for medication management services on  10/29/23 at 9:00 am.   You may also schedule an appointment for therapy services. Contact information: [address] [place] [address] [phone]         Joesph Gunner, Counselor. Go on 10/22/2023.   Why: You have an appointment for therapy services on 10/25/23 at 10:00 am, in person. Contact information: [facility], [address], [place], [place] [address]  P: [phone], and [phone]                Plan Of Care/Follow-up recommendations:  See discharge summary.   Blair Chiquita Hint, NP 10/23/2023, 12:38 PM

## 2023-10-23 NOTE — Group Note (Signed)
 Date:  10/23/2023 Time:  9:59 AM  Group Topic/Focus:  Goals Group:   The focus of this group is to help patients establish daily goals to achieve during treatment and discuss how the patient can incorporate goal setting into their daily lives to aide in recovery.    Participation Level:  Did Not Attend  Participation Quality:  na  Affect:  na  Cognitive:  na  Insight: None  Engagement in Group:  na  Modes of Intervention:  na  Additional Comments:  na  Nat Rummer 10/23/2023, 9:59 AM

## 2023-10-23 NOTE — Plan of Care (Signed)
  Problem: Nutritional: Goal: Ability to achieve adequate nutritional intake will improve Outcome: Completed/Met   Problem: Role Relationship: Goal: Ability to communicate needs accurately will improve Outcome: Completed/Met   Problem: Self-Concept: Goal: Will verbalize positive feelings about self Outcome: Progressing

## 2023-10-23 NOTE — Discharge Summary (Cosign Needed Addendum)
 Physician Discharge Summary Note  Patient:  Frank Stark is an 43 y.o., male MRN:  [id number] DOB:  04/29/1980 Patient phone:  [phone] (home)  Patient address:   [address] [place] [address],  Total Time spent with patient: 30 minutes  Date of Admission:  10/16/2023 Date of Discharge: 10/23/23   Reason for Admission:   Frank Stark is a 43 year old with history of anxiety, depression and HIV, admitted to [facility] after presenting to [facility] ED with worsening depressive symptoms in the context of auditory and tactile hallucinations ongoing for the past month.    Frank Stark is planned for discharge home where he resides with his mother. He is established with a outpatient psychiatrist and therapist. He is future oriented.  He denies any current suicidal ideation, intent, or plan and homicidal ideation at the time of discharge.  He is aware of follow-up plans and demonstrates insight into the importance of ongoing treatment. No current safety concerns or acute psychiatric symptoms observed.   Principal Problem: Severe recurrent major depressive disorder with psychotic features [facility] Discharge Diagnoses: Principal Problem:   Severe recurrent major depressive disorder with psychotic features (HCC) Active Problems:   Hallucinations   Past Psychiatric History: See H&P   Past Medical History:  Past Medical History:  Diagnosis Date   Anxiety    Depression    HIV (human immunodeficiency virus infection) (HCC)    History reviewed. No pertinent surgical history. Family History:  Family History  Problem Relation Age of Onset   Hypertension Mother    Hypertension Father    Asthma Sister    Diabetes Maternal Grandmother    Dementia Maternal Grandmother    Glaucoma Maternal Grandfather    Family Psychiatric  History: See H&P Social History:  Social History   Substance and Sexual Activity  Alcohol Use Not Currently   Comment: rarely      Social History   Substance and Sexual Activity  Drug Use Not Currently   Comment: occasional    Social History   Socioeconomic History   Marital status: Single    Spouse name: Not on file   Number of children: Not on file   Years of education: Not on file   Highest education level: Not on file  Occupational History   Not on file  Tobacco Use   Smoking status: Every Day    Current packs/day: 0.00    Types: Cigarettes   Smokeless tobacco: Never  Vaping Use   Vaping status: Every Day  Substance and Sexual Activity   Alcohol use: Not Currently    Comment: rarely   Drug use: Not Currently    Comment: occasional   Sexual activity: Yes    Partners: Male    Comment: pt given condoms  Other Topics Concern   Not on file  Social History Narrative   Not on file   Social Drivers of Health   Financial Resource Strain: Not on file  Food Insecurity: No Food Insecurity (10/16/2023)   Hunger Vital Sign    Worried About Running Out of Food in the Last Year: Never true    Ran Out of Food in the Last Year: Never true  Transportation Needs: No Transportation Needs (10/16/2023)   PRAPARE - Administrator, Civil Service (Medical): No    Lack of Transportation (Non-Medical): No  Physical Activity: Not on file  Stress: Not on file  Social Connections: Unknown (06/15/2021)   Received from [facility]  Social Network    Social Network: Not on file    Hospital Course:  During the patient's hospitalization, patient had extensive initial psychiatric evaluation, and follow-up psychiatric evaluations every day.  Psychiatric diagnoses provided upon initial assessment:  Principal Problem:   Severe recurrent major depressive disorder with psychotic features (HCC) Active Problems:   Hallucinations  Patient's psychiatric medications were adjusted on admission:  Seroquel  400 mg at bedtime was continued for psychotic symptoms, including auditory hallucinations.  Fluoxetine  40  mg daily was continued for depression.  Gabapentin  300 mg was continued for neuropathic pain and its beneficial effects on anxiety.  Trazodone  50 mg at bedtime was continued for insomnia.  During the hospitalization, other adjustments were made to the patient's psychiatric medication regimen:  Abilify  was initiated at 15 mg nightly and later increased to 20 mg nightly for management of auditory hallucinations.  Trazodone  was optimized to 200 mg nightly for insomnia.  Hydroxyzine  50 mg nightly was initiated for anxiety management and sleep support.  Fluoxetine  was discontinued.  Cymbalta  was started at 30 mg daily and later increased to 60 mg daily for depression.  Gabapentin  300 mg was continued for neuropathic pain and its beneficial effects on anxiety.  The patient reported no improvement in hallucinations since starting Seroquel . In addition, due to weight gain, new diagnosis of prediabetes, and elevated cholesterol, the regimen was adjusted to discontinue Seroquel  and transition to Abilify  as noted above.  Patient's care was discussed during the interdisciplinary team meeting every day during the hospitalization.  The patient denies having side effects to prescribed psychiatric medication.  Gradually, patient started adjusting to milieu. The patient was evaluated each day by a clinical provider to ascertain response to treatment. Improvement was noted by the patient's report of decreasing symptoms, improved sleep and appetite, affect, medication tolerance, behavior, and participation in unit programming.  Patient was asked each day to complete a self inventory noting mood, mental status, pain, new symptoms, anxiety and concerns.    Symptoms were reported as significantly decreased or resolved completely by discharge.   On day of discharge, the patient reports that their mood is stable. The patient denied having suicidal thoughts for more than 48 hours prior to discharge.  Patient  denies having homicidal thoughts.  Patient denies having auditory hallucinations.  Patient denies any visual hallucinations or other symptoms of psychosis. The patient was motivated to continue taking medication with a goal of continued improvement in mental health.   The patient reports their target psychiatric symptoms of anxiety, depression, insomnia and psychosis (auditory hallucinations) responded well to the psychiatric medications, and the patient reports overall benefit other psychiatric hospitalization. Supportive psychotherapy was provided to the patient. The patient also participated in regular group therapy while hospitalized. Coping skills, problem solving as well as relaxation therapies were also part of the unit programming.  Labs were reviewed with the patient, and abnormal results were discussed with the patient.  The patient is able to verbalize their individual safety plan to this provider.  # It is recommended to the patient to continue psychiatric medications as prescribed, after discharge from the hospital.    # It is recommended to the patient to follow up with your outpatient psychiatric provider and PCP.  # It was discussed with the patient, the impact of alcohol, drugs, tobacco have been there overall psychiatric and medical wellbeing, and total abstinence from substance use was recommended the patient.ed.  # Prescriptions provided or sent directly to preferred pharmacy at discharge. Patient agreeable  to plan. Given opportunity to ask questions. Appears to feel comfortable with discharge.    # In the event of worsening symptoms, the patient is instructed to call the crisis hotline, 911 and or go to the nearest ED for appropriate evaluation and treatment of symptoms. To follow-up with primary care provider for other medical issues, concerns and or health care needs  # Patient was discharged home 6) yeah that was [place] where he resides with his mother with a plan to follow up  as noted below.   Physical Findings: AIMS:  , ,  , 0 ,  ,  ,   CIWA:    COWS:     Musculoskeletal: Strength & Muscle Tone: within normal limits Gait & Station: normal Patient leans: N/A   Psychiatric Specialty Exam:  Presentation  General Appearance:  Casual  Eye Contact: Good  Speech: Clear and Coherent; Normal Rate  Speech Volume: Normal  Handedness: Right   Mood and Affect  Mood: Euthymic  Affect: Congruent   Thought Process  Thought Processes: Coherent; Linear  Descriptions of Associations:Intact  Orientation:Full (Time, Place and Person)  Thought Content:WDL  History of Schizophrenia/Schizoaffective disorder:No  Duration of Psychotic Symptoms:N/A  Hallucinations:Hallucinations: None Description of Auditory Hallucinations: -- (Denies)  Ideas of Reference:None  Suicidal Thoughts:Suicidal Thoughts: No  Homicidal Thoughts:Homicidal Thoughts: No   Sensorium  Memory: Immediate Good; Recent Good; Remote Good  Judgment: Intact  Insight: Present   Executive Functions  Concentration: Good  Attention Span: Good  Recall: Good  Fund of Knowledge: Good  Language: Good   Psychomotor Activity  Psychomotor Activity:Psychomotor Activity: Normal   Assets  Assets: Communication Skills; Desire for Improvement; Housing; Resilience; Social Support   Sleep  Sleep:Sleep: Good  Estimated Sleeping Duration (Last 24 Hours): 8.00-10.00 hours   Physical Exam: Physical Exam Vitals and nursing note reviewed.  Constitutional:      General: He is not in acute distress.    Appearance: Normal appearance. He is not ill-appearing.  HENT:     Mouth/Throat:     Pharynx: Oropharynx is clear.  Cardiovascular:     Rate and Rhythm: Normal rate.     Pulses: Normal pulses.  Pulmonary:     Effort: No respiratory distress.  Skin:    General: Skin is dry.  Neurological:     General: No focal deficit present.     Mental Status: He is  alert and oriented to person, place, and time.  Psychiatric:        Mood and Affect: Mood normal.        Behavior: Behavior normal.        Thought Content: Thought content normal.        Judgment: Judgment normal.    Review of Systems  Constitutional: Negative.   HENT: Negative.    Respiratory: Negative.    Cardiovascular: Negative.   Gastrointestinal: Negative.   Genitourinary: Negative.   Skin: Negative.   Neurological: Negative.   Psychiatric/Behavioral:  Positive for depression (Stable for lower level of care). Negative for hallucinations, memory loss, substance abuse and suicidal ideas. The patient is nervous/anxious (Stable for lower level of care) and has insomnia (Stable for lower level of care).    Blood pressure 121/82, pulse 86, temperature 98 F (36.7 C), resp. rate 16, height 5' 11 (1.803 m), weight 75.8 kg, SpO2 98%. Body mass index is 23.29 kg/m.   Social History   Tobacco Use  Smoking Status Every Day   Current packs/day: 0.00   Types:  Cigarettes  Smokeless Tobacco Never   Tobacco Cessation:  A prescription for an FDA-approved tobacco cessation medication provided at discharge   Blood Alcohol level:  Lab Results  Component Value Date   [facility] <15 10/15/2023    Metabolic Disorder Labs:  Lab Results  Component Value Date   HGBA1C 5.8 (H) 10/19/2023   MPG 119.76 10/19/2023   No results found for: PROLACTIN Lab Results  Component Value Date   CHOL 203 (H) 10/19/2023   TRIG 154 (H) 10/19/2023   HDL 64 10/19/2023   CHOLHDL 3.2 10/19/2023   VLDL 31 10/19/2023   LDLCALC 108 (H) 10/19/2023   LDLCALC 71 02/26/2012    See Psychiatric Specialty Exam and Suicide Risk Assessment completed by Attending Physician prior to discharge.  Discharge destination:  Home  Is patient on multiple antipsychotic therapies at discharge:  No   Has Patient had three or more failed trials of antipsychotic monotherapy by history:  No  Recommended Plan for Multiple  Antipsychotic Therapies: NA  Discharge Instructions     Activity as tolerated - No restrictions   Complete by: As directed    Diet - low sodium heart healthy   Complete by: As directed       Allergies as of 10/23/2023       Reactions   Levofloxacin Other (See Comments)   Confusion (intolerance)        Medication List     STOP taking these medications    FLUoxetine  40 MG capsule Commonly known as: PROZAC    guaiFENesin -codeine  100-10 MG/5ML syrup   QUEtiapine  400 MG tablet Commonly known as: SEROQUEL        TAKE these medications      Indication  acetaminophen  325 MG tablet Commonly known as: Tylenol  Take 2 tablets (650 mg total) by mouth every 6 (six) hours as needed.  Indication: Fever, Pain   albuterol  108 (90 Base) MCG/ACT inhaler Commonly known as: VENTOLIN  HFA Inhale 1-2 puffs into the lungs every 6 (six) hours as needed for wheezing or shortness of breath.  Indication: SOB.   ARIPiprazole  20 MG tablet Commonly known as: ABILIFY  Take 1 tablet (20 mg total) by mouth at bedtime.  Indication: Psychosis   benzocaine  10 % mucosal gel Commonly known as: ORAJEL Use as directed in the mouth or throat 2 (two) times daily as needed for mouth pain.  Indication: Mouth pain   Biktarvy  50-200-25 MG Tabs tablet Generic drug: bictegravir-emtricitabine -tenofovir  AF Take 1 tablet by mouth daily.  Indication: HIV Disease   doxycycline  100 MG tablet Commonly known as: VIBRA -TABS Take 1 tablet (100 mg total) by mouth every 12 (twelve) hours.  Indication: Syphilis   DULoxetine  60 MG capsule Commonly known as: CYMBALTA  Take 1 capsule (60 mg total) by mouth daily. Start taking on: October 24, 2023  Indication: Major Depressive Disorder   gabapentin  300 MG capsule Commonly known as: NEURONTIN  Take 1 capsule (300 mg total) by mouth daily. For anxiety What changed: additional instructions  Indication: Anxiety   hydrOXYzine  50 MG tablet Commonly known as:  ATARAX  Take 1 tablet (50 mg total) by mouth at bedtime.  Indication: Feeling Anxious   lidocaine  5 % Commonly known as: LIDODERM  Place 1 patch onto the skin daily. Remove & Discard patch within 12 hours or as directed by MD Start taking on: October 24, 2023  Indication: Pain   meloxicam  7.5 MG tablet Commonly known as: MOBIC  Take 7.5 mg by mouth daily.  Indication: Joint Damage causing Pain and Loss of  Function   nicotine  21 mg/24hr patch Commonly known as: NICODERM CQ  - dosed in mg/24 hours Place 1 patch (21 mg total) onto the skin daily. Start taking on: October 24, 2023  Indication: Nicotine  Addiction   nicotine  polacrilex 2 MG gum Commonly known as: NICORETTE  Take 1 each (2 mg total) by mouth as needed for smoking cessation.  Indication: Nicotine  Addiction   traZODone  50 MG tablet Commonly known as: DESYREL  Take 1 tablet (50 mg total) by mouth at bedtime. For sleep.  Indication: Trouble Sleeping   traZODone  100 MG tablet Commonly known as: DESYREL  Take 2 tablets (200 mg total) by mouth at bedtime.  Indication: Trouble Sleeping   Vitamin D  (Ergocalciferol ) 1.25 MG (50000 UNIT) Caps capsule Commonly known as: DRISDOL  Take 1 capsule (50,000 Units total) by mouth 2 (two) times daily for 8 doses.  Indication: Vitamin D  Deficiency        Follow-up Information     [facility] Go on 10/29/2023.   Why: You have an appointment for medication management services on  10/29/23 at 9:00 am.   You may also schedule an appointment for therapy services. Contact information: [address] [place] [address] [phone]         Joesph Gunner, Counselor. Go on 10/22/2023.   Why: You have an appointment for therapy services on 10/25/23 at 10:00 am, in person. Contact information: [facility], [address]th floor [address], [place], [place] [address]  P: [phone], and [phone]                 Follow-up recommendations:   Activity: as tolerated  Diet: heart healthy  Other: -Follow-up with your outpatient psychiatric provider -instructions on appointment date, time, and address (location) are provided to you in discharge paperwork.  -Take your psychiatric medications as prescribed at discharge - instructions are provided to you in the discharge paperwork  -Follow-up with outpatient primary care doctor and other specialists -for management of preventative medicine and chronic medical disease:  HIV; Syphilis  -Testing: Follow-up with outpatient provider for abnormal lab results:  Low vitamin D  level; RPR reactive; Hemoglobin A1c mildly elevated  -If you are prescribed an atypical antipsychotic medication, we recommend that your outpatient psychiatrist follow routine screening for side effects within 3 months of discharge, including monitoring: AIMS scale, height, weight, blood pressure, fasting lipid panel, HbA1c, and fasting blood sugar.   -Recommend total abstinence from alcohol, tobacco, and other illicit drug use at discharge.   -If your psychiatric symptoms recur, worsen, or if you have side effects to your psychiatric medications, call your outpatient psychiatric provider, 911, [phone] or go to the nearest emergency department.  -If suicidal thoughts occur, immediately call your outpatient psychiatric provider, 911, [phone] or go to the nearest emergency department.   Signed: Blair Chiquita Hint, NP 10/23/2023, 8:46 PM

## 2023-10-23 NOTE — Group Note (Signed)
 Date:  10/23/2023 Time:  11:49 AM  Group Type: Grief Processing Group Modality: Psychoeducational Duration: 1100-1125 Participants: 11 S - Subjective:  Participants expressed a range of emotions related to grief, including sadness, confusion, anger, and moments of acceptance. Several group members shared relief in realizing that their emotional fluctuations are normal and valid. O - Objective: Introduced the Doctor, hospital to normalize the non-linear nature of grief A - Assessment:  Clients demonstrated increased insight into their grief experiences and emotional responses. Participation was active and reflective.  P - Plan: Reinforce normalization of grief variability and encourage ongoing peer sharing    Participation Level:  Active  Participation Quality:  Appropriate  Affect:  Appropriate  Cognitive:  Appropriate  Insight: Appropriate  Engagement in Group:  Engaged  Modes of Intervention:  Discussion  Additional Comments:  None   Frank Stark [name] 10/23/2023, 11:49 AM

## 2023-10-23 NOTE — Progress Notes (Signed)
   10/23/23 0945  Psych Admission Type (Psych Patients Only)  Admission Status Voluntary  Psychosocial Assessment  Patient Complaints Anxiety  Eye Contact Fair  Facial Expression Anxious  Affect Anxious  Speech Logical/coherent  Interaction Assertive  Motor Activity Slow  Appearance/Hygiene Unremarkable  Behavior Characteristics Cooperative  Mood Pleasant  Thought Process  Coherency WDL  Content WDL  Delusions None reported or observed  Perception WDL  Hallucination None reported or observed  Judgment Poor  Confusion None  Danger to Self  Current suicidal ideation? Denies  Agreement Not to Harm Self Yes  Description of Agreement verbal  Danger to Others  Danger to Others None reported or observed  Danger to Others Abnormal  Harmful Behavior to others No threats or harm toward other people  Destructive Behavior No threats or harm toward property

## 2023-10-23 NOTE — Group Note (Signed)
 Recreation Therapy Group Note   Group Topic:Leisure Education  Group Date: 10/23/2023 Start Time: 0032 End Time: 1000 Facilitators: Jeyda Siebel-McCall, LRT,CTRS Location: [address] Dayroom   Group Topic: Leisure Education  Goal Area(s) Addresses:  Patient will identify positive leisure activities for use post discharge. Patient will identify at least one positive benefit of participation in leisure activities.  Patient will work effectively work with peers to keep the ball in play.  Behavioral Response:    Intervention: [employer], Music   Activity: Patients were to sit in a circle. Patients would toss a beach ball to each other keeping the ball in motion. LRT would time the group to see how long they could keep the ball moving. Patients could bounce or roll the ball but it could not come to a stop. If the ball were to stop moving, LRT would start the timer over.  Education:  Leisure Scientist, physiological, Special educational needs teacher, Teamwork, Discharge Planning  Education Outcome: Acknowledges education/In group clarification offered/Needs additional education.    Affect/Mood: N/A   Participation Level: Did not attend    Clinical Observations/Individualized Feedback:     Plan: Continue to engage patient in RT group sessions 2-3x/week.   Adamari Frede-McCall, LRT,CTRS  10/23/2023 12:50 PM

## 2023-10-23 NOTE — Progress Notes (Addendum)
  [facility] Adult Case Management Discharge Plan :  Will you be returning to the same living situation after discharge:  Yes,  patient lives with his parents At discharge, do you have transportation home?: Yes,  patient's mom, Frank Stark, [phone] will pick him up at 1 PM Do you have the ability to pay for your medications: No.  Release of information consent forms completed and in the chart;  Patient's signature needed at discharge.  Patient to Follow up at:  Follow-up Information     [facility] Go on 10/29/2023.   Why: You have an appointment for medication management services on  10/29/23 at 9:00 am.   You may also schedule an appointment for therapy services. Contact information: [address] [place] [address] [phone]         Joesph Gunner, Counselor. Go on 10/22/2023.   Why: You have an appointment for therapy services on 10/25/23 at 10:00 am, in person. Contact information: [facility], [address]th floor [address], [place], [place] [address]  P: [phone], and [phone]                Next level of care provider has access to [facility] Link:no  Safety Planning and Suicide Prevention discussed: Yes,  Frank Stark (mom) [phone]   Has patient been referred to the Quitline?: Yes, a referral has been submitted to Quitline on 10/18/2023.  Patient has been referred for addiction treatment: At admission, patient tested negative for all substances.  Patient said that he occasionally uses CBD gummies when he can't fall asleep, and used it last time about a month ago.       Aidan Moten O Dailan Pfalzgraf, LCSWA 10/23/2023, 11:18 AM

## 2023-10-23 NOTE — Plan of Care (Signed)
  Problem: Activity: Goal: Will verbalize the importance of balancing activity with adequate rest periods Outcome: Progressing   Problem: Education: Goal: Will be free of psychotic symptoms Outcome: Progressing Goal: Knowledge of the prescribed therapeutic regimen will improve Outcome: Progressing   Problem: Coping: Goal: Coping ability will improve Outcome: Progressing Goal: Will verbalize feelings Outcome: Progressing   Problem: Health Behavior/Discharge Planning: Goal: Compliance with prescribed medication regimen will improve Outcome: Progressing   Problem: Nutritional: Goal: Ability to achieve adequate nutritional intake will improve Outcome: Progressing   Problem: Role Relationship: Goal: Ability to communicate needs accurately will improve Outcome: Progressing Goal: Ability to interact with others will improve Outcome: Progressing   Problem: Safety: Goal: Ability to redirect hostility and anger into socially appropriate behaviors will improve Outcome: Progressing Goal: Ability to remain free from injury will improve Outcome: Progressing   Problem: Self-Care: Goal: Ability to participate in self-care as condition permits will improve Outcome: Progressing   Problem: Self-Concept: Goal: Will verbalize positive feelings about self Outcome: Progressing

## 2023-10-23 NOTE — Progress Notes (Signed)
   10/23/23 0400  Psych Admission Type (Psych Patients Only)  Admission Status Voluntary  Psychosocial Assessment  Patient Complaints Anxiety  Eye Contact Fair  Facial Expression Anxious  Affect Anxious  Speech Logical/coherent  Interaction Cautious  Motor Activity Slow  Appearance/Hygiene Unremarkable  Behavior Characteristics Cooperative  Mood Pleasant  Aggressive Behavior  Effect No apparent injury  Thought Process  Coherency WDL  Content WDL  Delusions None reported or observed  Perception WDL  Hallucination None reported or observed  Judgment Poor  Confusion None  Danger to Self  Current suicidal ideation? Denies  Description of Suicide Plan No plan  Self-Injurious Behavior No self-injurious ideation or behavior indicators observed or expressed   Agreement Not to Harm Self Yes  Description of Agreement Verbal  Danger to Others  Danger to Others None reported or observed  Danger to Others Abnormal  Harmful Behavior to others No threats or harm toward other people  Destructive Behavior No threats or harm toward property

## 2023-10-23 NOTE — Plan of Care (Signed)
  Problem: Activity: Goal: Will verbalize the importance of balancing activity with adequate rest periods Outcome: Completed/Met   Problem: Education: Goal: Will be free of psychotic symptoms Outcome: Completed/Met Goal: Knowledge of the prescribed therapeutic regimen will improve Outcome: Completed/Met   Problem: Coping: Goal: Coping ability will improve Outcome: Completed/Met Goal: Will verbalize feelings Outcome: Completed/Met   Problem: Health Behavior/Discharge Planning: Goal: Compliance with prescribed medication regimen will improve Outcome: Completed/Met   Problem: Nutritional: Goal: Ability to achieve adequate nutritional intake will improve Outcome: Completed/Met

## 2023-10-23 NOTE — Progress Notes (Signed)
 Patient ID: Frank Stark, male   DOB: 1980/03/15, 43 y.o.   MRN: [id number] Discharge instruction, medications, and follow up appointments reviewed, pt verbalized understanding. Pt's belongings returned to his satisfaction, pt discharged home.

## 2023-11-18 ENCOUNTER — Encounter (HOSPITAL_COMMUNITY): Payer: Self-pay

## 2023-11-18 ENCOUNTER — Emergency Department (HOSPITAL_COMMUNITY)
Admission: EM | Admit: 2023-11-18 | Discharge: 2023-11-18 | Discharge status: Against Medical Advice | Attending: Emergency Medicine | Admitting: Emergency Medicine

## 2023-11-18 ENCOUNTER — Other Ambulatory Visit: Payer: Self-pay

## 2023-11-18 DIAGNOSIS — Z5321 Procedure and treatment not carried out due to patient leaving prior to being seen by health care provider: Secondary | ICD-10-CM | POA: Diagnosis not present

## 2023-11-18 DIAGNOSIS — R519 Headache, unspecified: Secondary | ICD-10-CM | POA: Insufficient documentation

## 2023-11-18 DIAGNOSIS — R42 Dizziness and giddiness: Secondary | ICD-10-CM | POA: Insufficient documentation

## 2023-11-18 LAB — URINALYSIS, ROUTINE W REFLEX MICROSCOPIC
Bilirubin Urine: NEGATIVE
Glucose, UA: NEGATIVE mg/dL
Hgb urine dipstick: NEGATIVE
Ketones, ur: NEGATIVE mg/dL
Leukocytes,Ua: NEGATIVE
Nitrite: NEGATIVE
Protein, ur: NEGATIVE mg/dL
Specific Gravity, Urine: 1.011 (ref 1.005–1.030)
pH: 7 (ref 5.0–8.0)

## 2023-11-18 LAB — CBC
HCT: 37.3 % — ABNORMAL LOW (ref 39.0–52.0)
Hemoglobin: 12.5 g/dL — ABNORMAL LOW (ref 13.0–17.0)
MCH: 32.3 pg (ref 26.0–34.0)
MCHC: 33.5 g/dL (ref 30.0–36.0)
MCV: 96.4 fL (ref 80.0–100.0)
Platelets: 163 K/uL (ref 150–400)
RBC: 3.87 MIL/uL — ABNORMAL LOW (ref 4.22–5.81)
RDW: 12.3 % (ref 11.5–15.5)
WBC: 3 K/uL — ABNORMAL LOW (ref 4.0–10.5)
nRBC: 0 % (ref 0.0–0.2)

## 2023-11-18 LAB — COMPREHENSIVE METABOLIC PANEL WITH GFR
ALT: 10 U/L (ref 0–44)
AST: 18 U/L (ref 15–41)
Albumin: 3.8 g/dL (ref 3.5–5.0)
Alkaline Phosphatase: 58 U/L (ref 38–126)
Anion gap: 7 (ref 5–15)
BUN: 13 mg/dL (ref 6–20)
CO2: 28 mmol/L (ref 22–32)
Calcium: 9 mg/dL (ref 8.9–10.3)
Chloride: 104 mmol/L (ref 98–111)
Creatinine, Ser: 1.11 mg/dL (ref 0.61–1.24)
GFR, Estimated: >60 mL/min (ref >60)
Glucose, Bld: 87 mg/dL (ref 70–99)
Potassium: 4 mmol/L (ref 3.5–5.1)
Sodium: 139 mmol/L (ref 135–145)
Total Bilirubin: 0.7 mg/dL (ref 0.0–1.2)
Total Protein: 7.1 g/dL (ref 6.5–8.1)

## 2023-11-18 NOTE — ED Provider Triage Note (Signed)
 Emergency Medicine Provider Triage Evaluation Note  Frank Stark , a 43 y.o. male  was evaluated in triage.  Pt complains of 48 hours ago rolled over in bed and hit his head. Since then he has had mild HA and lightheadedness. He is worried is has concussion. No vision change, no slurred speach, no numbness or weakness in extremities.    Review of Systems  Positive: Mild anterior HA  Negative: LOC, confusion, vision change, AMS  Physical Exam  BP 118/75 (BP Location: Right Arm)   Pulse 74   Temp 97.9 F (36.6 C)   Resp 16   Ht 5' 10 (1.778 m)   Wt 75.3 kg   SpO2 100%   BMI 23.82 kg/m  Gen:   Awake, no distress   Resp:  Normal effort  MSK:   Moves extremities without difficulty  Other:  Head atraumatic.   Medical Decision Making  Medically screening exam initiated at 12:42 PM.  Appropriate orders placed.  YUTA CIPOLLONE was informed that the remainder of the evaluation will be completed by another provider, this initial triage assessment does not replace that evaluation, and the importance of remaining in the ED until their evaluation is complete.     Shermon Warren SAILOR, PA-C 11/18/23 1245

## 2023-11-18 NOTE — ED Triage Notes (Signed)
 PT presents POV for dizziness, rolled over and hit head on footboard of bed.  7/10 head pain.

## 2023-11-18 NOTE — ED Notes (Signed)
 Pt left the ED
